# Patient Record
Sex: Female | Born: 1950
Health system: Southern US, Community
[De-identification: ages and names within clinical notes are randomized; demographics above are authoritative.]

## PROBLEM LIST (undated history)

## (undated) DIAGNOSIS — B029 Zoster without complications: Secondary | ICD-10-CM

## (undated) DIAGNOSIS — E785 Hyperlipidemia, unspecified: Secondary | ICD-10-CM

## (undated) DIAGNOSIS — C801 Malignant (primary) neoplasm, unspecified: Secondary | ICD-10-CM

## (undated) DIAGNOSIS — E559 Vitamin D deficiency, unspecified: Secondary | ICD-10-CM

## (undated) HISTORY — PX: BREAST SURGERY: SHX581

## (undated) HISTORY — DX: Vitamin D deficiency, unspecified: E55.9

## (undated) HISTORY — DX: Zoster without complications: B02.9

## (undated) HISTORY — PX: BREAST LUMPECTOMY: SHX2

## (undated) HISTORY — DX: Hyperlipidemia, unspecified: E78.5

---

## 1998-06-26 ENCOUNTER — Encounter: Admission: RE | Admit: 1998-06-26 | Discharge: 1998-09-24 | Payer: Self-pay | Admitting: Radiation Oncology

## 1998-11-03 DIAGNOSIS — C801 Malignant (primary) neoplasm, unspecified: Secondary | ICD-10-CM

## 1998-11-03 HISTORY — DX: Malignant (primary) neoplasm, unspecified: C80.1

## 1999-11-04 HISTORY — PX: SHOULDER SURGERY: SHX246

## 2000-05-14 ENCOUNTER — Ambulatory Visit (HOSPITAL_BASED_OUTPATIENT_CLINIC_OR_DEPARTMENT_OTHER): Admission: RE | Admit: 2000-05-14 | Discharge: 2000-05-14 | Payer: Self-pay | Admitting: Orthopedic Surgery

## 2001-04-27 ENCOUNTER — Other Ambulatory Visit: Admission: RE | Admit: 2001-04-27 | Discharge: 2001-04-27 | Payer: Self-pay | Admitting: Obstetrics and Gynecology

## 2001-05-21 ENCOUNTER — Ambulatory Visit (HOSPITAL_COMMUNITY): Admission: RE | Admit: 2001-05-21 | Discharge: 2001-05-21 | Payer: Self-pay | Admitting: Obstetrics and Gynecology

## 2001-05-21 ENCOUNTER — Encounter: Payer: Self-pay | Admitting: Obstetrics and Gynecology

## 2001-06-17 ENCOUNTER — Ambulatory Visit (HOSPITAL_COMMUNITY): Admission: RE | Admit: 2001-06-17 | Discharge: 2001-06-17 | Payer: Self-pay | Admitting: Obstetrics and Gynecology

## 2002-05-26 ENCOUNTER — Encounter (HOSPITAL_COMMUNITY): Payer: Self-pay | Admitting: Oncology

## 2002-05-26 ENCOUNTER — Ambulatory Visit (HOSPITAL_COMMUNITY): Admission: RE | Admit: 2002-05-26 | Discharge: 2002-05-26 | Payer: Self-pay | Admitting: Oncology

## 2003-08-01 ENCOUNTER — Encounter (HOSPITAL_COMMUNITY): Payer: Self-pay | Admitting: Oncology

## 2003-08-01 ENCOUNTER — Encounter (HOSPITAL_COMMUNITY): Admission: RE | Admit: 2003-08-01 | Discharge: 2003-08-31 | Payer: Self-pay | Admitting: Oncology

## 2006-08-25 ENCOUNTER — Ambulatory Visit (HOSPITAL_COMMUNITY): Admission: RE | Admit: 2006-08-25 | Discharge: 2006-08-25 | Payer: Self-pay | Admitting: Family Medicine

## 2008-09-11 ENCOUNTER — Ambulatory Visit: Payer: Self-pay | Admitting: Family Medicine

## 2008-09-11 DIAGNOSIS — R252 Cramp and spasm: Secondary | ICD-10-CM

## 2008-09-11 DIAGNOSIS — Z9889 Other specified postprocedural states: Secondary | ICD-10-CM | POA: Insufficient documentation

## 2008-09-11 DIAGNOSIS — J309 Allergic rhinitis, unspecified: Secondary | ICD-10-CM | POA: Insufficient documentation

## 2008-09-15 ENCOUNTER — Encounter (INDEPENDENT_AMBULATORY_CARE_PROVIDER_SITE_OTHER): Payer: Self-pay | Admitting: Family Medicine

## 2008-09-18 ENCOUNTER — Ambulatory Visit: Payer: Self-pay | Admitting: Family Medicine

## 2008-09-18 ENCOUNTER — Other Ambulatory Visit: Admission: RE | Admit: 2008-09-18 | Discharge: 2008-09-18 | Payer: Self-pay | Admitting: Family Medicine

## 2008-09-18 ENCOUNTER — Encounter (INDEPENDENT_AMBULATORY_CARE_PROVIDER_SITE_OTHER): Payer: Self-pay | Admitting: Family Medicine

## 2008-09-18 DIAGNOSIS — R7309 Other abnormal glucose: Secondary | ICD-10-CM

## 2008-09-18 DIAGNOSIS — E785 Hyperlipidemia, unspecified: Secondary | ICD-10-CM

## 2008-09-18 LAB — CONVERTED CEMR LAB
AST: 17 units/L (ref 0–37)
BUN: 21 mg/dL (ref 6–23)
Basophils Relative: 1 % (ref 0–1)
CO2: 24 meq/L (ref 19–32)
Calcium: 9.4 mg/dL (ref 8.4–10.5)
Chloride: 102 meq/L (ref 96–112)
Cholesterol: 224 mg/dL — ABNORMAL HIGH (ref 0–200)
Creatinine, Ser: 0.8 mg/dL (ref 0.40–1.20)
Glucose, Bld: 101 mg/dL — ABNORMAL HIGH (ref 70–99)
HDL goal, serum: 40 mg/dL
HDL: 66 mg/dL (ref >39)
Hemoglobin: 13.7 g/dL (ref 12.0–15.0)
Lymphs Abs: 2.5 10*3/uL (ref 0.7–4.0)
MCHC: 31 g/dL (ref 30.0–36.0)
Monocytes Absolute: 0.6 10*3/uL (ref 0.1–1.0)
Monocytes Relative: 8 % (ref 3–12)
Neutro Abs: 4.2 10*3/uL (ref 1.7–7.7)
RBC: 4.58 M/uL (ref 3.87–5.11)
Total CHOL/HDL Ratio: 3.4
Triglycerides: 220 mg/dL — ABNORMAL HIGH (ref <150)

## 2008-11-21 ENCOUNTER — Ambulatory Visit (HOSPITAL_COMMUNITY): Admission: RE | Admit: 2008-11-21 | Discharge: 2008-11-21 | Payer: Self-pay | Admitting: Family Medicine

## 2009-06-26 ENCOUNTER — Encounter (INDEPENDENT_AMBULATORY_CARE_PROVIDER_SITE_OTHER): Payer: Self-pay | Admitting: Family Medicine

## 2009-10-03 ENCOUNTER — Ambulatory Visit (HOSPITAL_COMMUNITY): Admission: RE | Admit: 2009-10-03 | Discharge: 2009-10-03 | Payer: Self-pay | Admitting: Urology

## 2009-12-06 ENCOUNTER — Ambulatory Visit (HOSPITAL_COMMUNITY): Admission: RE | Admit: 2009-12-06 | Discharge: 2009-12-06 | Payer: Self-pay | Admitting: Internal Medicine

## 2010-11-24 ENCOUNTER — Encounter: Payer: Self-pay | Admitting: Family Medicine

## 2011-01-01 IMAGING — CT CT PELVIS WO/W CM
2 of 7 series · 12 of 46 positions shown, 18 images · IV contrast (omnipaque)
Comparison: Chest none

CT ABDOMEN

CLINICAL DATA: Microscopic hematuria, low back pain, past history
of breast cancer status post surgery and radiation therapy

CT ABDOMEN AND PELVIS WITHOUT AND WITH CONTRAST
TECHNIQUE: Multidetector CT imaging of the abdomen and pelvis was
performed following the standard protocol before and following the
bolus administration of intravenous contrast. Breast shield
utilized.  Sagittal and coronal MPR images reconstructed from axial
data set.
Contrast: 125 ml Omnipaque-MSS IV; water as oral contra

[Series 6: abd_pel_with 5.0 b40f · axial · 0.66mm/px · z∈[-424,-64]mm · 9 of 91 slices shown, 15 images]
[im 10/91  soft-tissue]
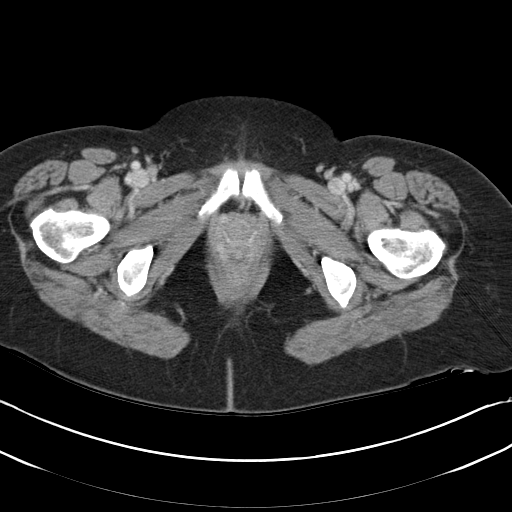
[im 10/91  bone]
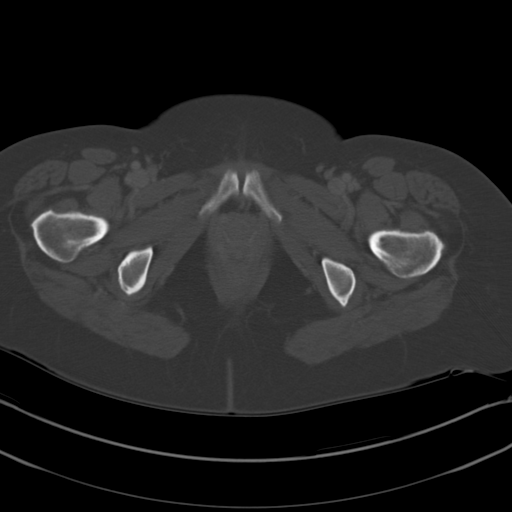
[im 19/91  soft-tissue]
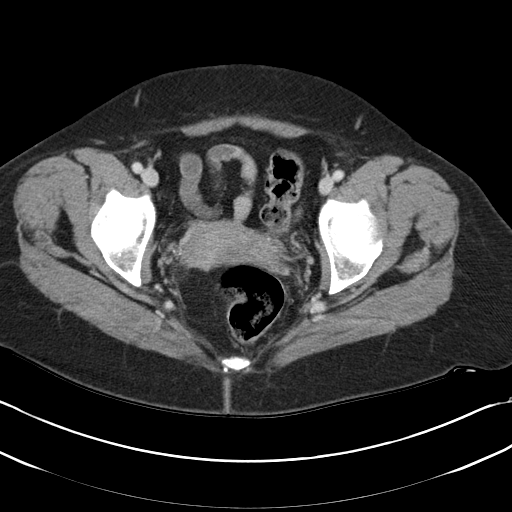
[im 28/91  soft-tissue]
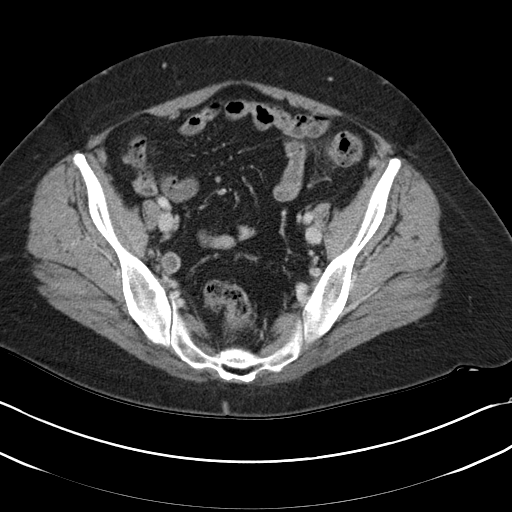
[im 37/91  soft-tissue]
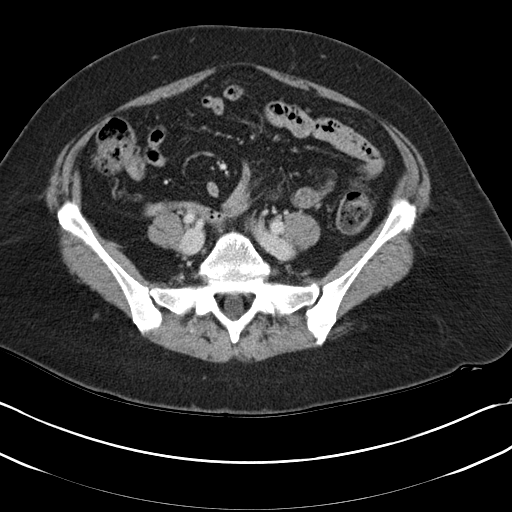
[im 46/91  soft-tissue]
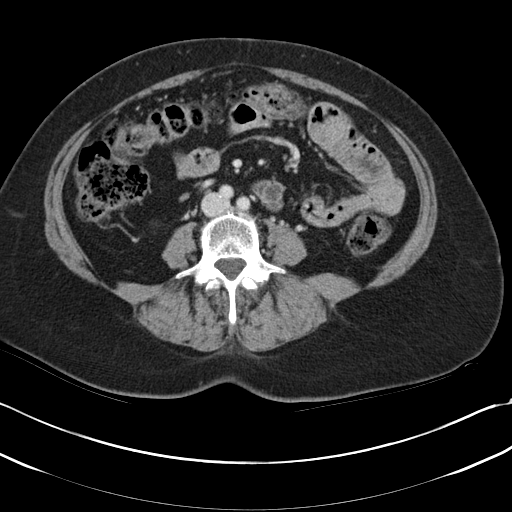
[im 55/91  soft-tissue]
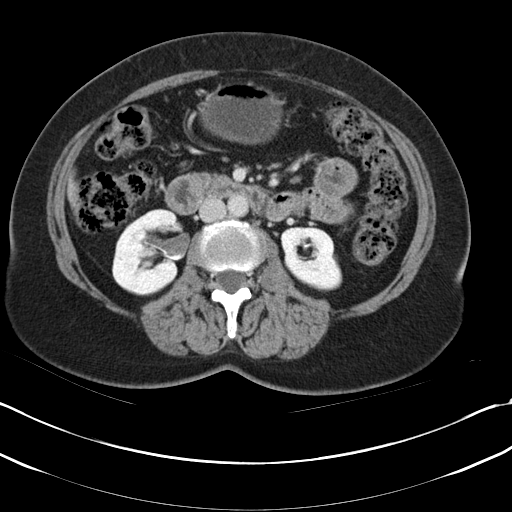
[im 55/91  lung]
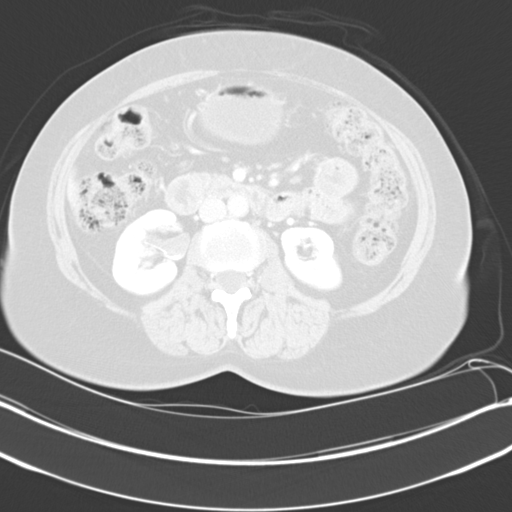
[im 64/91  soft-tissue]
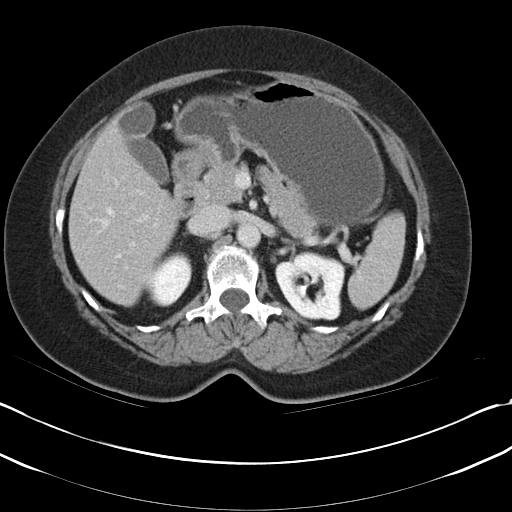
[im 64/91  lung]
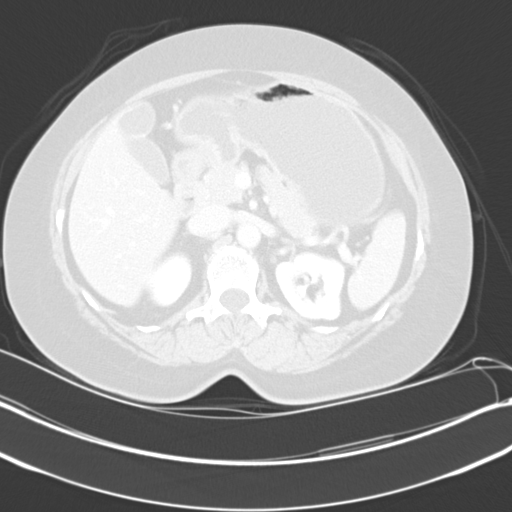
[im 73/91  soft-tissue]
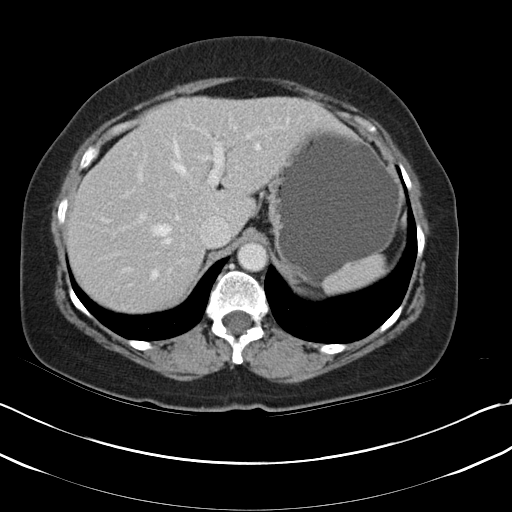
[im 73/91  lung]
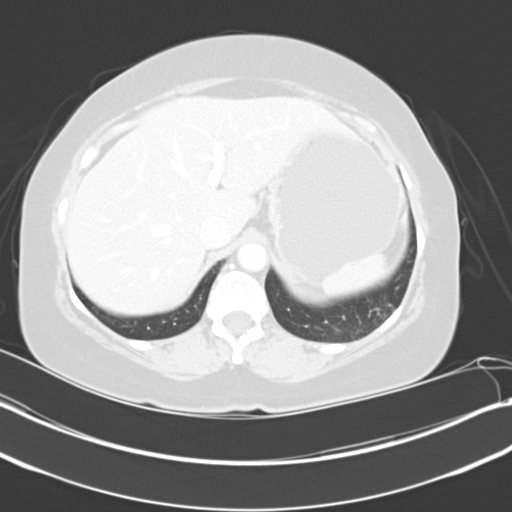
[im 82/91  soft-tissue]
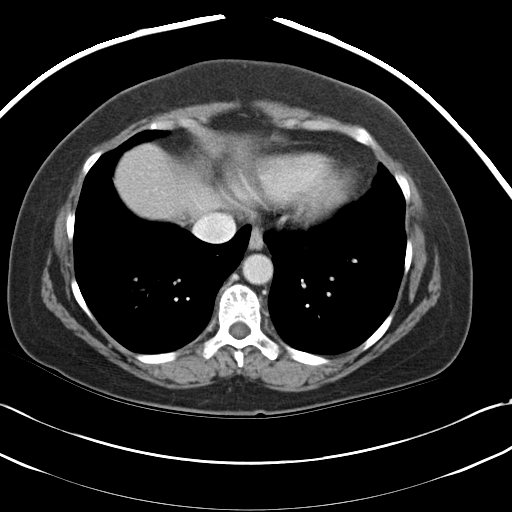
[im 82/91  lung]
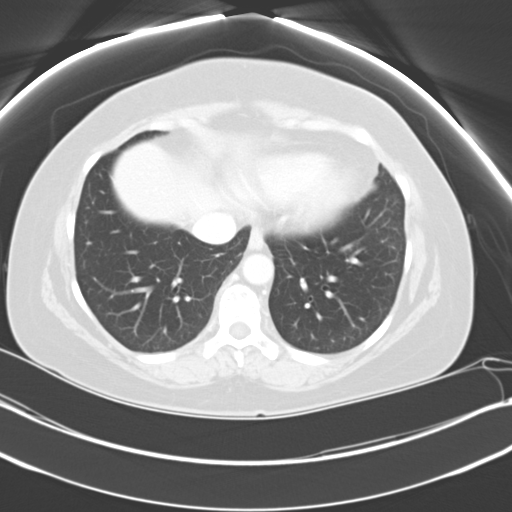
[im 82/91  bone]
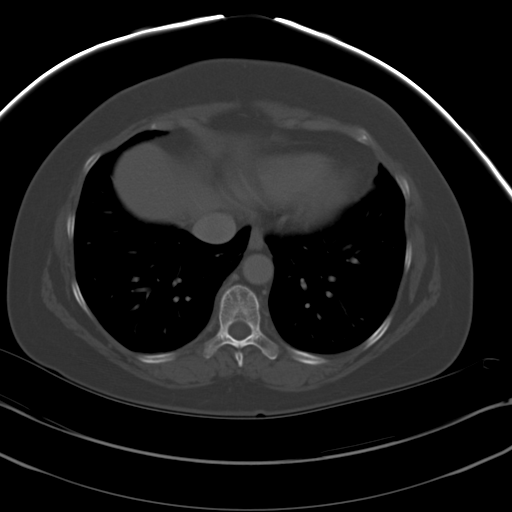

[Series 8: mpr coronal (id) · coronal · 0.65mm/px · 3 of 67 slices shown]
[im 17/67  soft-tissue]
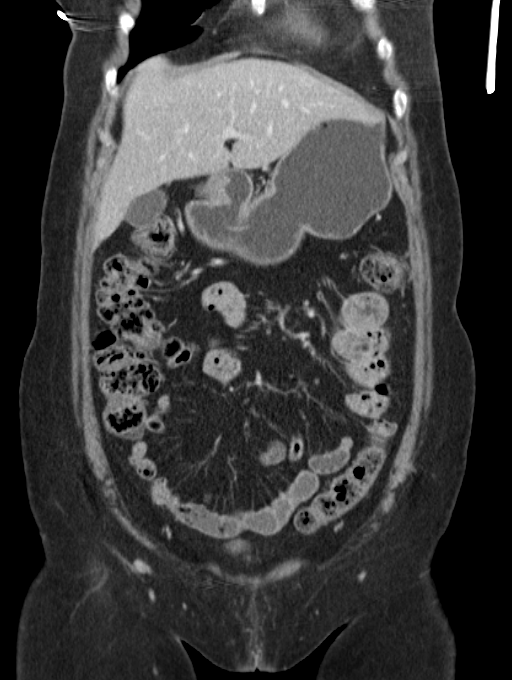
[im 34/67  soft-tissue]
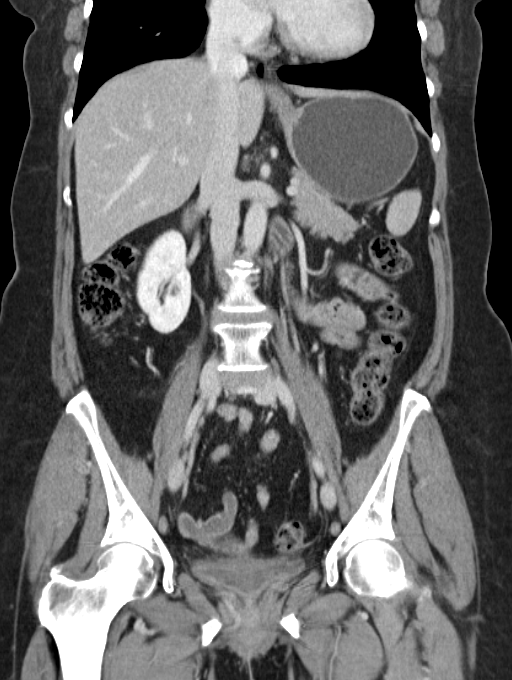
[im 50/67  soft-tissue]
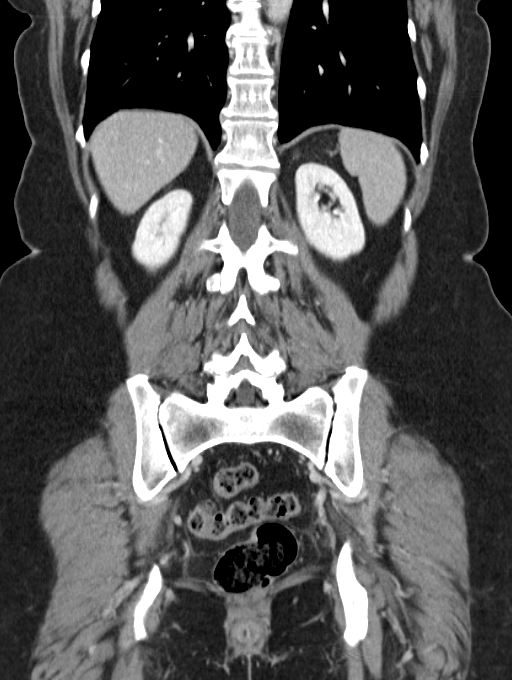

[12 of 46 positions shown; findings below may reference images not displayed]

FINDINGS: Lung bases clear.
No urinary tract calcification identified.
Liver, spleen, pancreas, kidneys, and adrenal glands normal.
Symmetric nephrograms bilaterally.
Multiple small cysts in both kidneys, more on the right, largest
right renal cyst measuring 1.9 x 1.4 cm image 33.
Mild right renal collecting system dilatation, best appreciated on
delayed coronal images.
Normal-caliber single ureters.
No filling defects identified within renal collecting systems or
ureters bilaterally.
Stomach and upper abdominal bowel loops normal appearance.
No mass, adenopathy, free fluid or inflammatory process.
IMPRESSION: Bilateral renal cysts.
Mild right hydronephrosis without evidence of mass or calculus.

CT PELVIS
FINDINGS: No distal ureteral calcification or dilatation.
Bladder unremarkable without mass or wall thickening.
Large and small bowel loops in pelvis normal appearance.
Normal appendix.
No pelvic mass, adenopathy, free fluid or inflammatory process.
Unremarkable uterus and adnexae.
No acute bony findings.
IMPRESSION: No acute intrapelvic abnormalities.

## 2011-03-21 NOTE — Op Note (Signed)
[facility]  Patient:    Ashley Lloyd, Ashley Lloyd                          MRN: [id number] Proc. Date: 05/14/00 Attending:  Nadara Mustard, M.D.                           Operative Report  PREOPERATIVE DIAGNOSIS:  Partial versus full-thickness tear, left shoulder, with impingement syndrome.  POSTOPERATIVE DIAGNOSES: 1. Grade 1 superior labrum anterior and posterior lesion. 2. Impingement syndrome with type 3 hooked acromion.  PROCEDURE: 1. Left shoulder arthroscopy with debridement of superior labrum anterior and    posterior lesion. 2. Subacromial decompression.  SURGEON:  Nadara Mustard, M.D.  ANESTHESIA:  General endotracheal plus interscalen block.  DISPOSITION:  To PACU in stable condition.  INDICATION FOR PROCEDURE:  Patient is a 60 year old woman with a longstanding history of increasing pain and decreasing range of motion of her left shoulder.  Patient has undergone conservative care with injections without relief.  MRI scan was positive for partial versus full-thickness rotator cuff tear and patient presents at this time for arthroscopic intervention.  The risks and benefits were discussed including infection, neurovascular injury, persistent pain, further rupture or tear of the rotator cuff; patient states she understands and wishes to proceed at this time.  DESCRIPTION OF PROCEDURE:  Patient was brought to OR #6 after undergoing an interscalen block.  She then underwent a general endotracheal anesthetic. After adequate level of anesthesia was obtained, patient was placed in the beach chair position and her left upper extremity was prepped using DuraPrep and draped into a sterile field.  The scope was inserted through the posterior portal.  Visualization showed an intact biceps and intact subscapularis tendon.  She did have fraying of her superior glenohumeral ligament as well as fraying of the labrum and insertion of the biceps.  Glenoid  and humeral head were intact with no osteochondral defects.  An operating portal was established anteriorly with the outside-in technique, first localized with an 18-gauge spinal.  Using the Linvatec probe, this was used to ablate bleeding vessels.  The shaver was used to debride the SLAP tear.  After debridement was performed, visualization of the entire rotator cuff showed there to be no full-thickness defects.  There was good attachment of the rotator cuff. Instruments were then removed and the scope was inserted through the subacromial space from the posterior portal and a lateral portal was established as a working portal.  A subacromial decompression was performed. The Linvatec ablator was used for hemostasis.   The acromionizer and shaver were used to debride the acromion.  The instruments were removed.  The portals were closed using 4-0 nylon.  The wound was covered with Adaptic, orthopedic sponges, ABD dressing and Hypafix tape.  Patient was extubated, placed in a sling and taken to PACU in stable condition.  Plan to follow up in the office in one week. DD:  05/14/00 TD:  05/14/00 Job: [id number]

## 2013-02-04 ENCOUNTER — Emergency Department (HOSPITAL_COMMUNITY)
Admission: EM | Admit: 2013-02-04 | Discharge: 2013-02-04 | Disposition: A | Discharge status: Home / Self Care | Payer: 59 | Attending: Emergency Medicine | Admitting: Emergency Medicine

## 2013-02-04 ENCOUNTER — Emergency Department (HOSPITAL_COMMUNITY): Payer: 59

## 2013-02-04 ENCOUNTER — Encounter (HOSPITAL_COMMUNITY): Payer: Self-pay | Admitting: *Deleted

## 2013-02-04 DIAGNOSIS — W010XXA Fall on same level from slipping, tripping and stumbling without subsequent striking against object, initial encounter: Secondary | ICD-10-CM | POA: Insufficient documentation

## 2013-02-04 DIAGNOSIS — S8002XA Contusion of left knee, initial encounter: Secondary | ICD-10-CM

## 2013-02-04 DIAGNOSIS — S8000XA Contusion of unspecified knee, initial encounter: Secondary | ICD-10-CM | POA: Insufficient documentation

## 2013-02-04 DIAGNOSIS — Z853 Personal history of malignant neoplasm of breast: Secondary | ICD-10-CM | POA: Insufficient documentation

## 2013-02-04 DIAGNOSIS — W1809XA Striking against other object with subsequent fall, initial encounter: Secondary | ICD-10-CM | POA: Insufficient documentation

## 2013-02-04 DIAGNOSIS — S93409A Sprain of unspecified ligament of unspecified ankle, initial encounter: Secondary | ICD-10-CM | POA: Insufficient documentation

## 2013-02-04 DIAGNOSIS — Y929 Unspecified place or not applicable: Secondary | ICD-10-CM | POA: Insufficient documentation

## 2013-02-04 DIAGNOSIS — Y9301 Activity, walking, marching and hiking: Secondary | ICD-10-CM | POA: Insufficient documentation

## 2013-02-04 HISTORY — DX: Malignant (primary) neoplasm, unspecified: C80.1

## 2013-02-04 MED ORDER — HYDROCODONE-ACETAMINOPHEN 5-325 MG PO TABS
2.0000 | ORAL_TABLET | Freq: Once | ORAL | Status: AC
  Administered 2013-02-04: 2 via ORAL
  Filled 2013-02-04: qty 2

## 2013-02-04 MED ORDER — HYDROCODONE-ACETAMINOPHEN 5-325 MG PO TABS: 1.0000 | ORAL_TABLET | ORAL | Status: DC | PRN

## 2013-02-04 NOTE — ED Provider Notes (Signed)
History     CSN: [id number]  Arrival date & time 02/04/13  1617   First MD Initiated Contact with Patient 02/04/13 1638      Chief Complaint  Patient presents with  . Fall    (Consider location/radiation/quality/duration/timing/severity/associated sxs/prior treatment) Patient is a 62 y.o. female presenting with fall. The history is provided by the patient.  Fall The accident occurred 6 to 12 hours ago. The fall occurred while walking. She landed on a hard floor. There was no blood loss. The point of impact was the left knee. The pain is present in the left knee (right ankle.). The pain is moderate. She was ambulatory at the scene. There was no drug use involved in the accident. There was no alcohol use involved in the accident. Pertinent negatives include no numbness, no abdominal pain, no bowel incontinence, no hematuria, no loss of consciousness and no tingling. The symptoms are aggravated by standing and ambulation. She has tried nothing for the symptoms.    Past Medical History  Diagnosis Date  . Cancer     Past Surgical History  Procedure Laterality Date  . Shoulder surgery    . Breast surgery    . Breast lumpectomy      History reviewed. No pertinent family history.  History  Substance Use Topics  . Smoking status: Never Smoker   . Smokeless tobacco: Not on file  . Alcohol Use: No    OB History   Grav Para Term Preterm Abortions TAB SAB Ect Mult Living                  Review of Systems  Constitutional: Negative for activity change.       All ROS Neg except as noted in HPI  HENT: Negative for nosebleeds and neck pain.   Eyes: Negative for photophobia and discharge.  Respiratory: Negative for cough, shortness of breath and wheezing.   Cardiovascular: Negative for chest pain and palpitations.  Gastrointestinal: Negative for abdominal pain, blood in stool and bowel incontinence.  Genitourinary: Negative for dysuria, frequency and hematuria.  Musculoskeletal:  Positive for arthralgias. Negative for back pain.  Skin: Negative.   Neurological: Negative for dizziness, tingling, seizures, loss of consciousness, speech difficulty and numbness.  Psychiatric/Behavioral: Negative for hallucinations and confusion.    Allergies  Celecoxib  Home Medications  No current outpatient prescriptions on file.  BP 151/63  Pulse 63  Temp(Src) 98.4 F (36.9 C) (Oral)  Resp 20  Ht 5' (1.524 m)  Wt 130 lb (58.968 kg)  BMI 25.39 kg/m2  SpO2 100%  Physical Exam  Nursing note and vitals reviewed. Constitutional: She is oriented to person, place, and time. She appears well-developed and well-nourished.  Non-toxic appearance.  HENT:  Head: Normocephalic.  Right Ear: Tympanic membrane and external ear normal.  Left Ear: Tympanic membrane and external ear normal.  Eyes: EOM and lids are normal. Pupils are equal, round, and reactive to light.  Neck: Normal range of motion. Neck supple. Carotid bruit is not present.  Cardiovascular: Normal rate, regular rhythm, normal heart sounds, intact distal pulses and normal pulses.   Pulmonary/Chest: Breath sounds normal. No respiratory distress.  Abdominal: Soft. Bowel sounds are normal. There is no tenderness. There is no guarding.  Musculoskeletal: Normal range of motion.  There is pain and mild bruise  To the anterior left knee.  No hot joint. Mild crepitus present with ROM. Good ROM of the right knee and hip. Pain with attempted ROM of the right  ankle. Achilles intact. Mild swelling of the right ankle. DP 2+.  Lymphadenopathy:       Head (right side): No submandibular adenopathy present.       Head (left side): No submandibular adenopathy present.    She has no cervical adenopathy.  Neurological: She is alert and oriented to person, place, and time. She has normal strength. No cranial nerve deficit or sensory deficit.  Skin: Skin is warm and dry.  Psychiatric: She has a normal mood and affect. Her speech is normal.     ED Course  Procedures (including critical care time)  Labs Reviewed - No data to display No results found.   No diagnosis found.    MDM  I have reviewed nursing notes, vital signs, and all appropriate lab and imaging results for this patient.  Xray of the right ankle is negative for fx. Suspect ankle sprain and contusion to the left knee. Pt has a walker at home to use. She is fitted with ASO splint and Rx for Norco given. Pt to follow up with orthopedics for additional evaluation if not improving.      Kathie Dike, PA-C 02/05/13 [phone]

## 2013-02-04 NOTE — ED Notes (Addendum)
Fall, tripped when moving wood,  Pain rt ankle, foot.  And lt knee  Ice and elevation rt ankle

## 2013-02-05 NOTE — ED Provider Notes (Signed)
Medical screening examination/treatment/procedure(s) were performed by non-physician practitioner and as supervising physician I was immediately available for consultation/collaboration.   Benny Lennert, MD 02/05/13 [phone]

## 2013-02-15 ENCOUNTER — Emergency Department (INDEPENDENT_AMBULATORY_CARE_PROVIDER_SITE_OTHER)
Admission: EM | Admit: 2013-02-15 | Discharge: 2013-02-15 | Disposition: A | Discharge status: Home / Self Care | Payer: 59 | Source: Home / Self Care | Attending: Family Medicine | Admitting: Family Medicine

## 2013-02-15 ENCOUNTER — Encounter (HOSPITAL_COMMUNITY): Payer: Self-pay

## 2013-02-15 ENCOUNTER — Emergency Department (INDEPENDENT_AMBULATORY_CARE_PROVIDER_SITE_OTHER): Payer: 59

## 2013-02-15 DIAGNOSIS — M7062 Trochanteric bursitis, left hip: Secondary | ICD-10-CM

## 2013-02-15 DIAGNOSIS — M76899 Other specified enthesopathies of unspecified lower limb, excluding foot: Secondary | ICD-10-CM

## 2013-02-15 MED ORDER — DICLOFENAC POTASSIUM 50 MG PO TABS: 50.0000 mg | ORAL_TABLET | Freq: Three times a day (TID) | ORAL | Status: DC

## 2013-02-15 NOTE — ED Notes (Signed)
States she fell about 2 weeks ago onto RIGHT side, but has only recently developed pain in her left hip. C/o she cannot bear weight

## 2013-02-15 NOTE — ED Provider Notes (Signed)
History     CSN: [id number]  Arrival date & time 02/15/13  1111   First MD Initiated Contact with Patient 02/15/13 1151      Chief Complaint  Patient presents with  . Hip Pain    (Consider location/radiation/quality/duration/timing/severity/associated sxs/prior treatment) Patient is a 62 y.o. female presenting with hip pain. The history is provided by the patient.  Hip Pain This is a new problem. The current episode started 2 days ago (fell on 4/4 and sprained right ankle, seen in ER, recently develpoed left hip pain, affecting walking.). The problem has been gradually worsening. The symptoms are aggravated by walking.    Past Medical History  Diagnosis Date  . Cancer     Past Surgical History  Procedure Laterality Date  . Shoulder surgery    . Breast surgery    . Breast lumpectomy      History reviewed. No pertinent family history.  History  Substance Use Topics  . Smoking status: Never Smoker   . Smokeless tobacco: Not on file  . Alcohol Use: No    OB History   Grav Para Term Preterm Abortions TAB SAB Ect Mult Living                  Review of Systems  Constitutional: Negative.   Musculoskeletal: Positive for gait problem. Negative for myalgias, back pain and joint swelling.  Neurological: Negative for numbness.    Allergies  Celecoxib  Home Medications   Current Outpatient Rx  Name  Route  Sig  Dispense  Refill  . HYDROcodone-acetaminophen (NORCO/VICODIN) 5-325 MG per tablet   Oral   Take 1 tablet by mouth every 4 (four) hours as needed for pain.   15 tablet   0   . diclofenac (CATAFLAM) 50 MG tablet   Oral   Take 1 tablet (50 mg total) by mouth 3 (three) times daily.   30 tablet   0     BP 140/58  Pulse 64  Temp(Src) 97.8 F (36.6 C) (Oral)  Resp 16  SpO2 100%  Physical Exam  Nursing note and vitals reviewed. Constitutional: She is oriented to person, place, and time. She appears well-developed and well-nourished.    Musculoskeletal: She exhibits tenderness.       Left hip: She exhibits normal range of motion, no swelling, no crepitus and no deformity.       Legs: Neurological: She is alert and oriented to person, place, and time.  Skin: Skin is warm and dry.    ED Course  Procedures (including critical care time)  Labs Reviewed - No data to display Dg Hip Complete Left  02/15/2013  *RADIOLOGY REPORT*  Clinical Data: Left hip pain since falling approximately 10 days ago.  LEFT HIP - COMPLETE 2+ VIEW  Comparison: Abdominal pelvic CT 10/03/2009.  Findings: The mineralization and alignment are normal.  There is no evidence of acute fracture or dislocation.  There is no evidence of femoral head avascular necrosis.  Mild degenerative changes are present at both hips.  In addition, there is spurring at both greater trochanters, similar to the prior CT.  IMPRESSION: Stable examination.  No acute osseous findings.   Original Report Authenticated By: Carey Bullocks, M.D.      1. Greater trochanteric bursitis of left hip       MDM  X-rays reviewed and report per radiologist.         Linna Hoff, MD 02/15/13 [phone]

## 2015-02-05 ENCOUNTER — Emergency Department (INDEPENDENT_AMBULATORY_CARE_PROVIDER_SITE_OTHER)
Admission: EM | Admit: 2015-02-05 | Discharge: 2015-02-05 | Disposition: A | Discharge status: Other Acute Inpatient Hospital | Payer: BLUE CROSS/BLUE SHIELD | Source: Home / Self Care | Attending: Family Medicine | Admitting: Family Medicine

## 2015-02-05 ENCOUNTER — Emergency Department (HOSPITAL_COMMUNITY)
Admission: EM | Admit: 2015-02-05 | Discharge: 2015-02-06 | Disposition: A | Discharge status: Home / Self Care | Payer: BLUE CROSS/BLUE SHIELD | Attending: Emergency Medicine | Admitting: Emergency Medicine

## 2015-02-05 ENCOUNTER — Emergency Department (HOSPITAL_COMMUNITY): Payer: BLUE CROSS/BLUE SHIELD

## 2015-02-05 ENCOUNTER — Encounter (HOSPITAL_COMMUNITY): Payer: Self-pay

## 2015-02-05 DIAGNOSIS — B0229 Other postherpetic nervous system involvement: Secondary | ICD-10-CM | POA: Diagnosis not present

## 2015-02-05 DIAGNOSIS — Z859 Personal history of malignant neoplasm, unspecified: Secondary | ICD-10-CM | POA: Insufficient documentation

## 2015-02-05 DIAGNOSIS — R1011 Right upper quadrant pain: Secondary | ICD-10-CM

## 2015-02-05 DIAGNOSIS — Z791 Long term (current) use of non-steroidal anti-inflammatories (NSAID): Secondary | ICD-10-CM | POA: Diagnosis not present

## 2015-02-05 DIAGNOSIS — B0223 Postherpetic polyneuropathy: Secondary | ICD-10-CM

## 2015-02-05 LAB — CBC WITH DIFFERENTIAL/PLATELET
Basophils Absolute: 0.1 10*3/uL (ref 0.0–0.1)
Basophils Relative: 1 % (ref 0–1)
Eosinophils Absolute: 0.1 10*3/uL (ref 0.0–0.7)
Eosinophils Relative: 2 % (ref 0–5)
HEMATOCRIT: 42.7 % (ref 36.0–46.0)
HEMOGLOBIN: 14.2 g/dL (ref 12.0–15.0)
LYMPHS PCT: 29 % (ref 12–46)
Lymphs Abs: 2.4 10*3/uL (ref 0.7–4.0)
MCH: 30.9 pg (ref 26.0–34.0)
MCHC: 33.3 g/dL (ref 30.0–36.0)
MCV: 92.8 fL (ref 78.0–100.0)
MONO ABS: 0.7 10*3/uL (ref 0.1–1.0)
MONOS PCT: 8 % (ref 3–12)
NEUTROS ABS: 5.1 10*3/uL (ref 1.7–7.7)
Neutrophils Relative %: 60 % (ref 43–77)
Platelets: 297 10*3/uL (ref 150–400)
RBC: 4.6 MIL/uL (ref 3.87–5.11)
RDW: 14 % (ref 11.5–15.5)
WBC: 8.3 10*3/uL (ref 4.0–10.5)

## 2015-02-05 LAB — COMPREHENSIVE METABOLIC PANEL
ALBUMIN: 4.2 g/dL (ref 3.5–5.2)
ALT: 15 U/L (ref 0–35)
ANION GAP: 7 (ref 5–15)
AST: 23 U/L (ref 0–37)
Alkaline Phosphatase: 66 U/L (ref 39–117)
BUN: 14 mg/dL (ref 6–23)
CALCIUM: 9.4 mg/dL (ref 8.4–10.5)
CO2: 27 mmol/L (ref 19–32)
Chloride: 101 mmol/L (ref 96–112)
Creatinine, Ser: 0.86 mg/dL (ref 0.50–1.10)
GFR calc non Af Amer: 70 mL/min — ABNORMAL LOW (ref >90)
GFR, EST AFRICAN AMERICAN: 82 mL/min — AB (ref >90)
GLUCOSE: 98 mg/dL (ref 70–99)
Potassium: 4.2 mmol/L (ref 3.5–5.1)
Sodium: 135 mmol/L (ref 135–145)
TOTAL PROTEIN: 7.5 g/dL (ref 6.0–8.3)
Total Bilirubin: 0.7 mg/dL (ref 0.3–1.2)

## 2015-02-05 LAB — LIPASE, BLOOD: Lipase: 38 U/L (ref 11–59)

## 2015-02-05 LAB — I-STAT TROPONIN, ED: TROPONIN I, POC: 0 ng/mL (ref 0.00–0.08)

## 2015-02-05 MED ORDER — HYDROCODONE-ACETAMINOPHEN 5-325 MG PO TABS
1.0000 | ORAL_TABLET | Freq: Once | ORAL | Status: AC
  Administered 2015-02-05: 1 via ORAL

## 2015-02-05 MED ORDER — ONDANSETRON 4 MG PO TBDP
8.0000 mg | ORAL_TABLET | Freq: Once | ORAL | Status: AC
  Administered 2015-02-05: 8 mg via ORAL

## 2015-02-05 MED ORDER — OXYCODONE-ACETAMINOPHEN 5-325 MG PO TABS: 1.0000 | ORAL_TABLET | Freq: Four times a day (QID) | ORAL | Status: DC | PRN

## 2015-02-05 MED ORDER — ONDANSETRON 4 MG PO TBDP
ORAL_TABLET | ORAL | Status: AC
  Filled 2015-02-05: qty 1

## 2015-02-05 MED ORDER — OXYCODONE-ACETAMINOPHEN 5-325 MG PO TABS
1.0000 | ORAL_TABLET | Freq: Once | ORAL | Status: AC
Start: 2015-02-05 — End: 2015-02-05
  Administered 2015-02-05: 1 via ORAL
  Filled 2015-02-05: qty 1

## 2015-02-05 MED ORDER — HYDROCODONE-ACETAMINOPHEN 5-325 MG PO TABS
ORAL_TABLET | ORAL | Status: AC
  Filled 2015-02-05: qty 1

## 2015-02-05 MED ORDER — ACYCLOVIR 800 MG PO TABS: 800.0000 mg | ORAL_TABLET | Freq: Every day | ORAL | Status: AC

## 2015-02-05 NOTE — ED Provider Notes (Signed)
Ashley Lloyd is a 64 y.o. female who presents to Urgent Care today for abdominal pain. Patient has a 4 day history of worsening right upper quadrant abdominal pain radiating to the back. The pain seems to worse after eating. No fevers chills vomiting or diarrhea. The pain is nonexertional. She feels well otherwise. She has tried ibuprofen which has not helped.   Past Medical History  Diagnosis Date  . Cancer    Past Surgical History  Procedure Laterality Date  . Shoulder surgery    . Breast surgery    . Breast lumpectomy     History  Substance Use Topics  . Smoking status: Never Smoker   . Smokeless tobacco: Not on file  . Alcohol Use: No   ROS as above Medications: No current facility-administered medications for this encounter.   Current Outpatient Prescriptions  Medication Sig Dispense Refill  . diclofenac (CATAFLAM) 50 MG tablet Take 1 tablet (50 mg total) by mouth 3 (three) times daily. 30 tablet 0  . HYDROcodone-acetaminophen (NORCO/VICODIN) 5-325 MG per tablet Take 1 tablet by mouth every 4 (four) hours as needed for pain. 15 tablet 0   Allergies  Allergen Reactions  . Celecoxib Swelling and Rash    CELEBREX REACTION: Rash , swelling     Exam:  BP 157/72 mmHg  Pulse 60  Temp(Src) 97.5 F (36.4 C) (Oral)  Resp 18  SpO2 100% Gen: Well NAD HEENT: EOMI,  MMM Lungs: Normal work of breathing. CTABL Heart: RRR no MRG Abd: NABS, Soft. Nondistended, tender palpation right upper quadrant with positive Murphy sign. Exts: Brisk capillary refill, warm and well perfused.   No results found for this or any previous visit (from the past 24 hour(s)). No results found.  Assessment and Plan: 64 y.o. female with 4 days of worsening right upper quadrant abdominal pain. Concerning for cholecystitis versus pancreatitis. Chest etiology is also a possibility. Transfer to emergency department for evaluation and management.  Patient was given 5 mg by 325 hydrocodone prior to  transfer.  Discussed warning signs or symptoms. Please see discharge instructions. Patient expresses understanding.     Gregor Hams, MD 02/05/15 1800

## 2015-02-05 NOTE — Discharge Instructions (Signed)
Shingles Shingles is caused by the same virus that causes chickenpox. The first feelings may be pain or tingling. A rash will follow in a couple days. The rash may occur on any area of the body. Long-lasting pain is more likely in an elderly person. It can last months to years. There are medicines that can help prevent pain if you start taking them early. HOME CARE   Take cool baths or place cool cloths on the rash as told by your doctor.  Take medicine only as told by your doctor.  Rest as told by your doctor.  Keep your rash clean with mild soap and cool water or as told by your doctor.  Do not scratch your rash. You may use calamine lotion to relieve itchy skin as told by your doctor.  Keep your rash covered with a loose bandage (dressing).  Avoid touching:  Babies.  Pregnant women.  Children with inflamed skin (eczema).  People who have gotten organ transplants.  People with chronic illnesses, such as leukemia or AIDS.  Wear loose-fitting clothing.  If the rash is on the face, you may need to see a specialist. Keep all appointments. Shingles must be kept away from the eyes, if possible.  Keep all follow-up visits as told by your doctor. GET HELP RIGHT AWAY IF:   You have any pain on the face or eye.  You lose feeling on one side of your face.  You have ear pain or ringing in your ear.  You cannot taste as well.  Your medicines do not help the pain.  Your redness or puffiness (swelling) spreads.  You feel like you are getting worse.  You have a fever. MAKE SURE YOU:   Understand these instructions.  Will watch your condition.  Will get help right away if you are not doing well or get worse. Document Released: 04/07/2008 Document Revised: 03/06/2014 Document Reviewed: 04/07/2008 Exit[employer] Patient Information 2015 [employer]. This information is not intended to replace advice given to you by your health care provider. Make sure you discuss any questions  you have with your health care provider.  

## 2015-02-05 NOTE — ED Notes (Signed)
Pt. Coming from urgent care. Here for mid epigastric pain that radiates around to back. Denies N/V/D, fevers, SOB. Took ibuprofen with no relief.

## 2015-02-05 NOTE — ED Notes (Signed)
C/o pain epigastric , right flank, right upper back x 3 days. No better w ice, motrin

## 2015-02-05 NOTE — ED Provider Notes (Signed)
CSN: [id number]     Arrival date & time 02/05/15  1836 History   First MD Initiated Contact with Patient 02/05/15 2207     Chief Complaint  Patient presents with  . Abdominal Pain     (Consider location/radiation/quality/duration/timing/severity/associated sxs/prior Treatment) Patient is a 64 y.o. female presenting with abdominal pain. The history is provided by the patient.  Abdominal Pain Pain location:  RUQ Pain quality: burning   Pain radiates to:  Back Pain severity:  Moderate Onset quality:  Gradual Duration:  4 days Timing:  Constant Progression:  Worsening Chronicity:  New Relieved by:  Nothing Worsened by:  Nothing tried Ineffective treatments:  NSAIDs Associated symptoms: no constipation, no cough, no dysuria, no fever, no melena, no nausea, no shortness of breath and no vomiting     Past Medical History  Diagnosis Date  . Cancer    Past Surgical History  Procedure Laterality Date  . Shoulder surgery    . Breast surgery    . Breast lumpectomy     No family history on file. History  Substance Use Topics  . Smoking status: Never Smoker   . Smokeless tobacco: Not on file  . Alcohol Use: No   OB History    No data available     Review of Systems  Constitutional: Negative for fever.  Respiratory: Negative for cough and shortness of breath.   Gastrointestinal: Positive for abdominal pain. Negative for nausea, vomiting, constipation and melena.  Genitourinary: Negative for dysuria.  All other systems reviewed and are negative.     Allergies  Celecoxib  Home Medications   Prior to Admission medications   Medication Sig Start Date End Date Taking? Authorizing Provider  ibuprofen (ADVIL,MOTRIN) 200 MG tablet Take 200 mg by mouth every 6 (six) hours as needed for moderate pain.   Yes Historical Provider, MD  acyclovir (ZOVIRAX) 800 MG tablet Take 1 tablet (800 mg total) by mouth 5 (five) times daily. 02/06/15 02/11/15  Larence Penning, MD  diclofenac  (CATAFLAM) 50 MG tablet Take 1 tablet (50 mg total) by mouth 3 (three) times daily. Patient not taking: Reported on 02/05/2015 02/15/13   Billy Fischer, MD  HYDROcodone-acetaminophen (NORCO/VICODIN) 5-325 MG per tablet Take 1 tablet by mouth every 4 (four) hours as needed for pain. Patient not taking: Reported on 02/05/2015 02/04/13   Lily Kocher, PA-C  oxyCODONE-acetaminophen (PERCOCET/ROXICET) 5-325 MG per tablet Take 1 tablet by mouth every 6 (six) hours as needed for severe pain. 02/05/15   Larence Penning, MD   BP 151/63 mmHg  Pulse 63  Temp(Src) 97.5 F (36.4 C)  Resp 18  SpO2 100% Physical Exam  Constitutional: She is oriented to person, place, and time. She appears well-developed and well-nourished. No distress.  HENT:  Head: Normocephalic and atraumatic.  Mouth/Throat: No oropharyngeal exudate.  Eyes: Pupils are equal, round, and reactive to light.  Neck: Normal range of motion.  Cardiovascular: Normal rate, regular rhythm, normal heart sounds and intact distal pulses.   Pulmonary/Chest: Effort normal and breath sounds normal. No respiratory distress. She has no wheezes. She has no rales.  Abdominal: Soft. She exhibits no distension. There is no tenderness.   nontender to deep palpation. Negative Murphy's. 4 or 5 small papules below the right breast in the area of described burning pain. No CVA tenderness.  Neurological: She is alert and oriented to person, place, and time.  Skin: Skin is warm and dry. No rash noted. She is not diaphoretic.  Psychiatric: She  has a normal mood and affect. Her behavior is normal. Judgment and thought content normal.    ED Course  Procedures (including critical care time) Labs Review Labs Reviewed  COMPREHENSIVE METABOLIC PANEL - Abnormal; Notable for the following:    GFR calc non Af Amer 70 (*)    GFR calc Af Amer 82 (*)    All other components within normal limits  CBC WITH DIFFERENTIAL/PLATELET  LIPASE, BLOOD  URINALYSIS, ROUTINE W REFLEX  MICROSCOPIC  I-STAT TROPOININ, ED    Imaging Review Dg Chest 2 View  02/05/2015   CLINICAL DATA:  Acute chest pain for 2 days.  EXAM: CHEST  2 VIEW  COMPARISON:  None.  FINDINGS: The heart size and mediastinal contours are within normal limits. Both lungs are clear. No pneumothorax or pleural effusion is noted. The visualized skeletal structures are unremarkable.  IMPRESSION: No active cardiopulmonary disease.   Electronically Signed   By: Marijo Conception, M.D.   On: 02/05/2015 19:52   US Abdomen Limited Ruq  02/05/2015   CLINICAL DATA:  Right upper quadrant and back pain for 3 days.  EXAM: US ABDOMEN LIMITED - RIGHT UPPER QUADRANT  COMPARISON:  None.  FINDINGS: Gallbladder:  No gallstones or wall thickening visualized. No sonographic Murphy sign noted.  Common bile duct:  Diameter: 3.3 mm, normal  Liver:  No focal lesion identified. Within normal limits in parenchymal echogenicity.  IMPRESSION: Normal examination.   Electronically Signed   By: Lucienne Capers M.D.   On: 02/05/2015 23:36     EKG Interpretation None      MDM   Final diagnoses:  RUQ pain  Acute herpes zoster neuropathy    64 year old female presents with burning right upper quadrant pain radiating around in a bandlike distribution to the back. Symptoms present for 4 days. Not associated with eating. She has no abdominal tenderness on my exam and negative Murphy's. Documentation from urgent care shows right upper quadrant tenderness with positive Murphy's. She does have some papules in the area of the burning pain , although she states this is not new. The description of the pain in the area with dermatomal distribution is highly concerning for possible zoster. However, given description of pain in documentation from urgent care, we'll obtain right upper quadrant ultrasound to rule out cholecystitis. Laboratory workup performed at urgent care with no elevation in LFTs, normal lipase , normal blood counts. Vital signs stable. If  ultrasound is negative, will treat for suspected Herpes Zoster with pain control and acyclovir.   11:45 PM ultrasound negative for biliary pathology. We'll treat as herpes zoster as documented above.  Larence Penning, MD 02/06/15 9450  Quintella Reichert, MD 02/06/15 [phone]

## 2015-02-22 ENCOUNTER — Encounter: Payer: Self-pay | Admitting: Internal Medicine

## 2015-02-22 ENCOUNTER — Ambulatory Visit (INDEPENDENT_AMBULATORY_CARE_PROVIDER_SITE_OTHER): Payer: BLUE CROSS/BLUE SHIELD | Admitting: Internal Medicine

## 2015-02-22 VITALS — BP 128/60 | HR 64 | Temp 98.0°F | Resp 14 | Ht 59.0 in | Wt 127.4 lb

## 2015-02-22 DIAGNOSIS — B029 Zoster without complications: Secondary | ICD-10-CM | POA: Insufficient documentation

## 2015-02-22 DIAGNOSIS — Z1239 Encounter for other screening for malignant neoplasm of breast: Secondary | ICD-10-CM | POA: Diagnosis not present

## 2015-02-22 DIAGNOSIS — Z13220 Encounter for screening for lipoid disorders: Secondary | ICD-10-CM | POA: Insufficient documentation

## 2015-02-22 NOTE — Assessment & Plan Note (Signed)
Recent episode of shingles. Pain now well controlled with prn ibuprofen. Will continue.

## 2015-02-22 NOTE — Patient Instructions (Signed)
Labs today.  Follow up 6 months. 

## 2015-02-22 NOTE — Progress Notes (Signed)
Pre visit review using our clinic review tool, if applicable. No additional management support is needed unless otherwise documented below in the visit note. 

## 2015-02-22 NOTE — Progress Notes (Signed)
   Subjective:    Patient ID: Ashley Lloyd, female    DOB: 25-Mar-1951, 64 y.o.   MRN: [id number]  HPI  64YO female presents to establish care.  Recently treated for shingles. Continues to have pain, worse at night. Treated with Valtrex and Percocet initially. Now only taking prn Ibuprofen with relief of symptoms.  No other concerns today. Very active at home. Follows healthy diet. Has not recently had mammogram.   Past medical, surgical, family and social history per today's encounter.  Review of Systems  Constitutional: Negative for fever, chills, appetite change, fatigue and unexpected weight change.  Eyes: Negative for visual disturbance.  Respiratory: Negative for shortness of breath.   Cardiovascular: Negative for chest pain and leg swelling.  Gastrointestinal: Negative for nausea, vomiting, abdominal pain, diarrhea and constipation.  Musculoskeletal: Negative for myalgias and arthralgias.  Skin: Negative for color change and rash.  Hematological: Negative for adenopathy. Does not bruise/bleed easily.  Psychiatric/Behavioral: Negative for suicidal ideas, sleep disturbance and dysphoric mood. The patient is not nervous/anxious.        Objective:    BP 128/60 mmHg  Pulse 64  Temp(Src) 98 F (36.7 C) (Oral)  Resp 14  Ht 4\' 11"  (1.499 m)  Wt 127 lb 6.4 oz (57.788 kg)  BMI 25.72 kg/m2  SpO2 97% Physical Exam  Constitutional: She is oriented to person, place, and time. She appears well-developed and well-nourished. No distress.  HENT:  Head: Normocephalic and atraumatic.  Right Ear: External ear normal.  Left Ear: External ear normal.  Nose: Nose normal.  Mouth/Throat: Oropharynx is clear and moist. No oropharyngeal exudate.  Eyes: Conjunctivae and EOM are normal. Pupils are equal, round, and reactive to light. Right eye exhibits no discharge.  Neck: Normal range of motion. Neck supple. No thyromegaly present.  Cardiovascular: Normal rate, regular rhythm, normal heart  sounds and intact distal pulses.  Exam reveals no gallop and no friction rub.   No murmur heard. Pulmonary/Chest: Effort normal. No respiratory distress. She has no wheezes. She has no rales.  Abdominal: Soft. Bowel sounds are normal. She exhibits no distension and no mass. There is no tenderness. There is no rebound and no guarding.  Musculoskeletal: Normal range of motion. She exhibits no edema or tenderness.  Lymphadenopathy:    She has no cervical adenopathy.  Neurological: She is alert and oriented to person, place, and time. No cranial nerve deficit. Coordination normal.  Skin: Skin is warm and dry. Rash noted. Rash is maculopapular. She is not diaphoretic. No erythema. No pallor.     Psychiatric: She has a normal mood and affect. Her behavior is normal. Judgment and thought content normal.          Assessment & Plan:   Problem List Items Addressed This Visit      Unprioritized   Screening for breast cancer    Encouraged pt to get screening mammogram. Mammogram ordered.      Relevant Orders   MM Digital Screening   Screening, lipid    Will check lipids with labs today.      Relevant Orders   Lipid panel   Shingles - Primary    Recent episode of shingles. Pain now well controlled with prn ibuprofen. Will continue.          Return in about 6 months (around 08/24/2015) for Physical.

## 2015-02-22 NOTE — Assessment & Plan Note (Signed)
Encouraged pt to get screening mammogram. Mammogram ordered.

## 2015-02-22 NOTE — Assessment & Plan Note (Signed)
Will check lipids with labs today.  

## 2015-02-23 LAB — LIPID PANEL
Cholesterol: 232 mg/dL — ABNORMAL HIGH (ref 0–200)
HDL: 73 mg/dL (ref >39.00)
LDL CALC: 141 mg/dL — AB (ref 0–99)
NONHDL: 159
Total CHOL/HDL Ratio: 3
Triglycerides: 92 mg/dL (ref 0.0–149.0)
VLDL: 18.4 mg/dL (ref 0.0–40.0)

## 2015-02-26 ENCOUNTER — Encounter: Payer: Self-pay | Admitting: *Deleted

## 2015-11-13 ENCOUNTER — Encounter: Payer: Self-pay | Admitting: Internal Medicine

## 2015-11-13 ENCOUNTER — Ambulatory Visit (INDEPENDENT_AMBULATORY_CARE_PROVIDER_SITE_OTHER): Payer: BLUE CROSS/BLUE SHIELD | Admitting: Internal Medicine

## 2015-11-13 ENCOUNTER — Ambulatory Visit: Payer: BLUE CROSS/BLUE SHIELD | Admitting: Internal Medicine

## 2015-11-13 VITALS — BP 131/71 | HR 69 | Temp 98.2°F | Ht 59.0 in | Wt 125.0 lb

## 2015-11-13 DIAGNOSIS — H109 Unspecified conjunctivitis: Secondary | ICD-10-CM | POA: Diagnosis not present

## 2015-11-13 DIAGNOSIS — R059 Cough, unspecified: Secondary | ICD-10-CM

## 2015-11-13 DIAGNOSIS — R05 Cough: Secondary | ICD-10-CM | POA: Diagnosis not present

## 2015-11-13 MED ORDER — POLYMYXIN B-TRIMETHOPRIM 10000-0.1 UNIT/ML-% OP SOLN: 1.0000 [drp] | OPHTHALMIC | Status: DC

## 2015-11-13 MED ORDER — HYDROCODONE-HOMATROPINE 5-1.5 MG/5ML PO SYRP: 5.0000 mL | ORAL_SOLUTION | Freq: Four times a day (QID) | ORAL | Status: DC | PRN

## 2015-11-13 MED ORDER — AZITHROMYCIN 250 MG PO TABS: ORAL_TABLET | ORAL | Status: DC

## 2015-11-13 NOTE — Progress Notes (Signed)
Pre visit review using our clinic review tool, if applicable. No additional management support is needed unless otherwise documented below in the visit note. 

## 2015-11-13 NOTE — Assessment & Plan Note (Signed)
Cough symptoms x nearly 4 weeks. Symptoms most consistent with Pertussis. Will start Azithromycin to prevent transmission to other. Discussed natural course of Pertussis with prolonged cough up to 3 months. Hycodan for cough. Discussed risks of this medication. Follow up if symptoms are not improving.

## 2015-11-13 NOTE — Progress Notes (Signed)
Subjective:    Patient ID: Ashley Lloyd, female    DOB: 1951/05/21, 65 y.o.   MRN: [id number]  HPI  65YO female presents for acute visit.  Started 2-3 weeks ago with congestion, cough. No fever, or chills. Has sore throat. Mild dyspnea. Watery eyes.  Episodes of cough are severe.  Cough occasionally productive. Taking Zyrtec and Mucinex and Theraflu with no improvement. Several family members sick.    Wt Readings from Last 3 Encounters:  11/13/15 125 lb (56.7 kg)  02/22/15 127 lb 6.4 oz (57.788 kg)  02/04/13 130 lb (58.968 kg)   BP Readings from Last 3 Encounters:  11/13/15 131/71  02/22/15 128/60  02/06/15 144/77    Past Medical History  Diagnosis Date  . Shingles   . Cancer [facility]) 2000    left breast cancer, s/p lumpectomy and XRT   Family History  Problem Relation Age of Onset  . Arthritis Mother   . Hypertension Mother   . Kidney disease Mother   . Cancer Mother     breast cancer, kidney cancer  . Arthritis Father   . Heart disease Father   . Cancer Maternal Aunt     breast cancer  . Fibromyalgia Sister   . Cancer Brother     pancreatic cancer   Past Surgical History  Procedure Laterality Date  . Shoulder surgery Left 2001    rotator cuff  . Breast surgery    . Breast lumpectomy    . Vaginal delivery      4x   Social History   Social History  . Marital Status: Married    Spouse Name: N/A  . Number of Children: N/A  . Years of Education: N/A   Social History Main Topics  . Smoking status: Never Smoker   . Smokeless tobacco: Never Used  . Alcohol Use: No  . Drug Use: No  . Sexual Activity: Yes    Birth Control/ Protection: Post-menopausal   Other Topics Concern  . None   Social History Narrative   Lives in [place] with husband.      Work - homemaker      Diet - regular      Exercise - gardening, very active around home    Review of Systems  Constitutional: Positive for fatigue. Negative for fever, chills and unexpected weight  change.  HENT: Positive for congestion, postnasal drip, rhinorrhea, sinus pressure and sore throat. Negative for ear discharge, ear pain, facial swelling, hearing loss, mouth sores, nosebleeds, sneezing, tinnitus, trouble swallowing and voice change.   Eyes: Positive for pain, discharge and redness. Negative for visual disturbance.  Respiratory: Positive for cough, shortness of breath and wheezing. Negative for chest tightness and stridor.   Cardiovascular: Negative for chest pain, palpitations and leg swelling.  Musculoskeletal: Negative for myalgias, arthralgias, neck pain and neck stiffness.  Skin: Negative for color change and rash.  Neurological: Negative for dizziness, weakness, light-headedness and headaches.  Hematological: Negative for adenopathy.       Objective:    BP 131/71 mmHg  Pulse 69  Temp(Src) 98.2 F (36.8 C) (Oral)  Ht 4\' 11"  (1.499 m)  Wt 125 lb (56.7 kg)  BMI 25.23 kg/m2  SpO2 98% Physical Exam  Constitutional: She is oriented to person, place, and time. She appears well-developed and well-nourished. No distress.  HENT:  Head: Normocephalic and atraumatic.  Right Ear: External ear normal.  Left Ear: External ear normal.  Nose: Nose normal.  Mouth/Throat: Oropharynx is clear and  moist. No oropharyngeal exudate or posterior oropharyngeal erythema.    Eyes: Conjunctivae are normal. Pupils are equal, round, and reactive to light. Right eye exhibits no discharge. Left eye exhibits no discharge. No scleral icterus.  Neck: Normal range of motion. Neck supple. No tracheal deviation present. No thyromegaly present.  Cardiovascular: Normal rate, regular rhythm, normal heart sounds and intact distal pulses.  Exam reveals no gallop and no friction rub.   No murmur heard. Pulmonary/Chest: Effort normal and breath sounds normal. No accessory muscle usage. No tachypnea. No respiratory distress. She has no decreased breath sounds. She has no wheezes. She has no rhonchi. She  has no rales. She exhibits no tenderness.  Musculoskeletal: Normal range of motion. She exhibits no edema or tenderness.  Lymphadenopathy:    She has no cervical adenopathy.  Neurological: She is alert and oriented to person, place, and time. No cranial nerve deficit. She exhibits normal muscle tone. Coordination normal.  Skin: Skin is warm and dry. No rash noted. She is not diaphoretic. No erythema. No pallor.  Psychiatric: She has a normal mood and affect. Her behavior is normal. Judgment and thought content normal.          Assessment & Plan:   Problem List Items Addressed This Visit      Unprioritized   Bilateral conjunctivitis    Bilateral conjunctivitis likely related to viral infection, however given new purulent exudate, will add empiric Polytrim to cover bacterial conjunctivitis. Follow up prn if symptoms not improving.      Relevant Medications   trimethoprim-polymyxin b (POLYTRIM) ophthalmic solution   Cough - Primary    Cough symptoms x nearly 4 weeks. Symptoms most consistent with Pertussis. Will start Azithromycin to prevent transmission to other. Discussed natural course of Pertussis with prolonged cough up to 3 months. Hycodan for cough. Discussed risks of this medication. Follow up if symptoms are not improving.      Relevant Medications   azithromycin (ZITHROMAX Z-PAK) 250 MG tablet   HYDROcodone-homatropine (HYCODAN) 5-1.5 MG/5ML syrup       Return if symptoms worsen or fail to improve.

## 2015-11-13 NOTE — Assessment & Plan Note (Signed)
Bilateral conjunctivitis likely related to viral infection, however given new purulent exudate, will add empiric Polytrim to cover bacterial conjunctivitis. Follow up prn if symptoms not improving.

## 2015-11-13 NOTE — Patient Instructions (Signed)
Start Azithromycin.  Use Codeine based syrup as needed for cough.  Start Polytrim to eyes to help with infection.  Call if any worsening symptoms, fever, chest pain or other concerns.

## 2016-10-08 DIAGNOSIS — Z23 Encounter for immunization: Secondary | ICD-10-CM | POA: Diagnosis not present

## 2016-10-08 DIAGNOSIS — E559 Vitamin D deficiency, unspecified: Secondary | ICD-10-CM | POA: Diagnosis not present

## 2016-10-08 DIAGNOSIS — E785 Hyperlipidemia, unspecified: Secondary | ICD-10-CM | POA: Diagnosis not present

## 2016-12-02 DIAGNOSIS — C50919 Malignant neoplasm of unspecified site of unspecified female breast: Secondary | ICD-10-CM | POA: Diagnosis not present

## 2016-12-02 DIAGNOSIS — E785 Hyperlipidemia, unspecified: Secondary | ICD-10-CM | POA: Diagnosis not present

## 2017-10-14 DIAGNOSIS — Z23 Encounter for immunization: Secondary | ICD-10-CM | POA: Diagnosis not present

## 2018-03-24 DIAGNOSIS — X32XXXA Exposure to sunlight, initial encounter: Secondary | ICD-10-CM | POA: Diagnosis not present

## 2018-03-24 DIAGNOSIS — L820 Inflamed seborrheic keratosis: Secondary | ICD-10-CM | POA: Diagnosis not present

## 2018-03-24 DIAGNOSIS — L570 Actinic keratosis: Secondary | ICD-10-CM | POA: Diagnosis not present

## 2018-06-21 DIAGNOSIS — R5383 Other fatigue: Secondary | ICD-10-CM | POA: Diagnosis not present

## 2018-06-21 DIAGNOSIS — C50919 Malignant neoplasm of unspecified site of unspecified female breast: Secondary | ICD-10-CM | POA: Diagnosis not present

## 2018-06-21 DIAGNOSIS — E785 Hyperlipidemia, unspecified: Secondary | ICD-10-CM | POA: Diagnosis not present

## 2018-08-12 DIAGNOSIS — E785 Hyperlipidemia, unspecified: Secondary | ICD-10-CM | POA: Diagnosis not present

## 2018-08-12 DIAGNOSIS — E559 Vitamin D deficiency, unspecified: Secondary | ICD-10-CM | POA: Diagnosis not present

## 2018-08-12 DIAGNOSIS — Z23 Encounter for immunization: Secondary | ICD-10-CM | POA: Diagnosis not present

## 2018-08-12 DIAGNOSIS — R5383 Other fatigue: Secondary | ICD-10-CM | POA: Diagnosis not present

## 2018-09-21 ENCOUNTER — Other Ambulatory Visit (HOSPITAL_COMMUNITY): Payer: Self-pay | Admitting: Internal Medicine

## 2018-09-21 ENCOUNTER — Ambulatory Visit (HOSPITAL_COMMUNITY)
Admission: RE | Admit: 2018-09-21 | Discharge: 2018-09-21 | Disposition: A | Discharge status: Home / Self Care | Payer: PPO | Source: Ambulatory Visit | Attending: Internal Medicine | Admitting: Internal Medicine

## 2018-09-21 DIAGNOSIS — M545 Low back pain, unspecified: Secondary | ICD-10-CM

## 2018-09-21 DIAGNOSIS — M4316 Spondylolisthesis, lumbar region: Secondary | ICD-10-CM | POA: Insufficient documentation

## 2018-09-21 DIAGNOSIS — M25551 Pain in right hip: Secondary | ICD-10-CM | POA: Diagnosis not present

## 2018-09-21 DIAGNOSIS — Z1231 Encounter for screening mammogram for malignant neoplasm of breast: Secondary | ICD-10-CM

## 2018-09-21 DIAGNOSIS — E559 Vitamin D deficiency, unspecified: Secondary | ICD-10-CM | POA: Diagnosis not present

## 2018-09-21 DIAGNOSIS — M47816 Spondylosis without myelopathy or radiculopathy, lumbar region: Secondary | ICD-10-CM | POA: Insufficient documentation

## 2018-09-24 ENCOUNTER — Ambulatory Visit (HOSPITAL_COMMUNITY): Payer: PPO

## 2018-09-27 ENCOUNTER — Ambulatory Visit (HOSPITAL_COMMUNITY): Payer: PPO

## 2018-12-02 ENCOUNTER — Other Ambulatory Visit (HOSPITAL_COMMUNITY): Payer: Self-pay | Admitting: Internal Medicine

## 2018-12-02 DIAGNOSIS — Z1231 Encounter for screening mammogram for malignant neoplasm of breast: Secondary | ICD-10-CM

## 2018-12-09 ENCOUNTER — Encounter (HOSPITAL_COMMUNITY): Payer: Self-pay

## 2018-12-09 ENCOUNTER — Ambulatory Visit (HOSPITAL_COMMUNITY)
Admission: RE | Admit: 2018-12-09 | Discharge: 2018-12-09 | Disposition: A | Discharge status: Home / Self Care | Payer: PPO | Source: Ambulatory Visit | Attending: Internal Medicine | Admitting: Internal Medicine

## 2018-12-09 DIAGNOSIS — Z1231 Encounter for screening mammogram for malignant neoplasm of breast: Secondary | ICD-10-CM | POA: Diagnosis not present

## 2019-01-20 DIAGNOSIS — M545 Low back pain: Secondary | ICD-10-CM | POA: Diagnosis not present

## 2019-01-20 DIAGNOSIS — E559 Vitamin D deficiency, unspecified: Secondary | ICD-10-CM | POA: Diagnosis not present

## 2019-01-20 DIAGNOSIS — E785 Hyperlipidemia, unspecified: Secondary | ICD-10-CM | POA: Diagnosis not present

## 2019-01-20 DIAGNOSIS — R5383 Other fatigue: Secondary | ICD-10-CM | POA: Diagnosis not present

## 2019-04-06 DIAGNOSIS — E559 Vitamin D deficiency, unspecified: Secondary | ICD-10-CM | POA: Diagnosis not present

## 2019-04-06 DIAGNOSIS — E785 Hyperlipidemia, unspecified: Secondary | ICD-10-CM | POA: Diagnosis not present

## 2019-04-06 DIAGNOSIS — M545 Low back pain: Secondary | ICD-10-CM | POA: Diagnosis not present

## 2019-05-23 ENCOUNTER — Ambulatory Visit (INDEPENDENT_AMBULATORY_CARE_PROVIDER_SITE_OTHER): Payer: PPO | Admitting: Internal Medicine

## 2019-06-27 DIAGNOSIS — M545 Low back pain: Secondary | ICD-10-CM | POA: Diagnosis not present

## 2019-07-13 ENCOUNTER — Other Ambulatory Visit: Payer: Self-pay

## 2019-07-13 ENCOUNTER — Ambulatory Visit (INDEPENDENT_AMBULATORY_CARE_PROVIDER_SITE_OTHER): Payer: PPO | Admitting: Internal Medicine

## 2019-07-13 ENCOUNTER — Encounter (INDEPENDENT_AMBULATORY_CARE_PROVIDER_SITE_OTHER): Payer: Self-pay | Admitting: Internal Medicine

## 2019-07-13 VITALS — BP 130/60 | HR 60 | Ht 60.0 in | Wt 133.8 lb

## 2019-07-13 DIAGNOSIS — E559 Vitamin D deficiency, unspecified: Secondary | ICD-10-CM | POA: Diagnosis not present

## 2019-07-13 DIAGNOSIS — Z13820 Encounter for screening for osteoporosis: Secondary | ICD-10-CM

## 2019-07-13 DIAGNOSIS — E785 Hyperlipidemia, unspecified: Secondary | ICD-10-CM

## 2019-07-13 NOTE — Progress Notes (Signed)
     Subjective:  Patient ID: Ashley Lloyd, female    DOB: 1951/08/28  Age: 68 y.o. MRN: [id number]  CC: This lady comes in for follow-up of her multiple medical problems including hyperlipidemia, vitamin D deficiency, history of breast cancer.  HPI She is doing reasonably well and when I saw her 2 weeks ago,, she was having acute low back pain.  This is now improved significantly although not completely resolved.  She still denies having any radiation of the pain down her legs or any difficulty with walking. Her hyperlipidemia has not required statin therapy. We will increase vitamin D3 supplementation to 5000 units daily and she has been taking this faithfully.   Past Medical History:  Diagnosis Date  . Cancer [facility]) 2000   left breast cancer, s/p lumpectomy and XRT  . HLD (hyperlipidemia)   . Shingles   . Vitamin D deficiency disease      Social History   Social History Narrative   Lives in [place] with husband.      Work - homemaker      Diet - regular      Exercise - gardening, very active around home   Social History   Substance and Sexual Activity  Alcohol Use No    Social History   Tobacco Use  Smoking Status Never Smoker  Smokeless Tobacco Never Used    Married   Current Meds  Medication Sig  . Cholecalciferol (VITAMIN D-3) 125 MCG (5000 UT) TABS Take 5,000 Units by mouth daily at 12 noon.       Objective:   Today's Vitals: BP 130/60   Pulse 60   Ht 5' (1.524 m)   Wt 133 lb 12.8 oz (60.7 kg)   BMI 26.13 kg/m  Vitals with BMI 07/13/2019 11/13/2015 02/22/2015  Height 5\' 0"  4\' 11"  4\' 11"   Weight 133 lbs 13 oz 125 lbs 127 lbs 6 oz  BMI 26.13 [id number] 0000000  Systolic [id number] 0000000  Diastolic 60 71 60  Pulse 60 69 64       Physical Exam She looks systemically well.  No new physical findings.   Assessment     1. Vitamin D deficiency disease   2. Hyperlipidemia, unspecified hyperlipidemia type   3. Screening for osteoporosis        Plan 1. She will continue with vitamin D3 supplementation. 2. She finally agreed for bone density scanning so we will set her up for that. 3. I did discuss colon cancer screening today and she is hesitant but she will think about it. 4. Blood work was taken for complete metabolic panel and vitamin D levels. 5. Further recommendations will depend on all these results and I will see her in about 3 months time for an annual physical exam. 6. I spent 25 minutes with this patient face-to-face, more than 50% of the time was involved in discussing the importance of colon cancer screening as well as bone density scanning.   Tests Ordered:   Orders Placed This Encounter  Procedures  . DG Bone Density  . COMPLETE METABOLIC PANEL WITH GFR  . VITAMIN D 25 Hydroxy (Vit-D Deficiency, Fractures)     Doree Albee, MD

## 2019-07-14 ENCOUNTER — Encounter (INDEPENDENT_AMBULATORY_CARE_PROVIDER_SITE_OTHER): Payer: Self-pay | Admitting: Internal Medicine

## 2019-07-14 LAB — COMPLETE METABOLIC PANEL WITH GFR
AG Ratio: 1.6 (calc) (ref 1.0–2.5)
ALT: 15 U/L (ref 6–29)
AST: 19 U/L (ref 10–35)
Albumin: 3.9 g/dL (ref 3.6–5.1)
Alkaline phosphatase (APISO): 58 U/L (ref 37–153)
BUN: 17 mg/dL (ref 7–25)
CO2: 24 mmol/L (ref 20–32)
Calcium: 9.3 mg/dL (ref 8.6–10.4)
Chloride: 106 mmol/L (ref 98–110)
Creat: 0.86 mg/dL (ref 0.50–0.99)
GFR, Est African American: 81 mL/min/{1.73_m2} (ref >=60)
GFR, Est Non African American: 70 mL/min/{1.73_m2} (ref >=60)
Globulin: 2.5 g/dL (calc) (ref 1.9–3.7)
Glucose, Bld: 100 mg/dL — ABNORMAL HIGH (ref 65–99)
Potassium: 4.7 mmol/L (ref 3.5–5.3)
Sodium: 142 mmol/L (ref 135–146)
Total Bilirubin: 0.3 mg/dL (ref 0.2–1.2)
Total Protein: 6.4 g/dL (ref 6.1–8.1)

## 2019-07-14 LAB — VITAMIN D 25 HYDROXY (VIT D DEFICIENCY, FRACTURES): Vit D, 25-Hydroxy: 78 ng/mL (ref 30–100)

## 2019-07-14 NOTE — Progress Notes (Signed)
I spoke to the patient's husband and informed him of his wife normal results and continue with the same dose of vitamin D3. Follow-up as scheduled.

## 2019-10-19 ENCOUNTER — Ambulatory Visit (INDEPENDENT_AMBULATORY_CARE_PROVIDER_SITE_OTHER): Payer: PPO | Admitting: Internal Medicine

## 2019-10-19 ENCOUNTER — Encounter (INDEPENDENT_AMBULATORY_CARE_PROVIDER_SITE_OTHER): Payer: Self-pay | Admitting: Internal Medicine

## 2019-10-19 ENCOUNTER — Other Ambulatory Visit: Payer: Self-pay

## 2019-10-19 VITALS — BP 130/60 | HR 72 | Ht 60.0 in | Wt 137.2 lb

## 2019-10-19 DIAGNOSIS — G8929 Other chronic pain: Secondary | ICD-10-CM | POA: Diagnosis not present

## 2019-10-19 DIAGNOSIS — E559 Vitamin D deficiency, unspecified: Secondary | ICD-10-CM

## 2019-10-19 DIAGNOSIS — M545 Low back pain, unspecified: Secondary | ICD-10-CM

## 2019-10-19 DIAGNOSIS — E782 Mixed hyperlipidemia: Secondary | ICD-10-CM | POA: Diagnosis not present

## 2019-10-19 DIAGNOSIS — Z13820 Encounter for screening for osteoporosis: Secondary | ICD-10-CM

## 2019-10-19 NOTE — Progress Notes (Signed)
Metrics: Intervention Frequency ACO  Documented Smoking Status Yearly  Screened one or more times in 24 months  Cessation Counseling or  Active cessation medication Past 24 months  Past 24 months   Guideline developer: UpToDate (See UpToDate for funding source) Date Released: 2014       Wellness Office Visit  Subjective:  Patient ID: Ashley Lloyd, female    DOB: 29-May-1951  Age: 68 y.o. MRN: [id number]  CC: This lady comes in for follow-up of vitamin D deficiency, hyperlipidemia and chronic low back pain. HPI She is doing reasonably well and continues with vitamin D3 supplementation.  Her vitamin D levels were good range last visit. I thought we had scheduled her for bone density scan but this did not happen so we will do it again.  Past Medical History:  Diagnosis Date  . Cancer [facility]) 2000   left breast cancer, s/p lumpectomy and XRT  . HLD (hyperlipidemia)   . Shingles   . Vitamin D deficiency disease       Family History  Problem Relation Age of Onset  . Arthritis Mother   . Hypertension Mother   . Kidney disease Mother   . Cancer Mother        breast cancer, kidney cancer  . Arthritis Father   . Heart disease Father   . Cancer Maternal Aunt        breast cancer  . Fibromyalgia Sister   . Cancer Brother        pancreatic cancer    Social History   Social History Narrative   Lives in [place] with husband.      Work - homemaker      Diet - regular      Exercise - gardening, very active around home   Social History   Tobacco Use  . Smoking status: Never Smoker  . Smokeless tobacco: Never Used  Substance Use Topics  . Alcohol use: No    Current Meds  Medication Sig  . Cholecalciferol (VITAMIN D-3) 125 MCG (5000 UT) TABS Take 5,000 Units by mouth daily at 12 noon.      Objective:   Today's Vitals: BP 130/60   Pulse 72   Ht 5' (1.524 m)   Wt 137 lb 3.2 oz (62.2 kg)   BMI 26.80 kg/m  Vitals with BMI 10/19/2019 07/13/2019 11/13/2015   Height 5\' 0"  5\' 0"  4\' 11"   Weight 137 lbs 3 oz 133 lbs 13 oz 125 lbs  BMI 26.8 123456 [id number]  Systolic [id number]  Diastolic 60 60 71  Pulse 72 60 69     Physical Exam  She looks systemically well.  No new physical findings today.     Assessment   1. Vitamin D deficiency disease   2. Mixed hyperlipidemia   3. Screening for osteoporosis   4. Chronic midline low back pain, unspecified whether sciatica present       Tests ordered Orders Placed This Encounter  Procedures  . DG Bone Density     Plan: 1. She will continue with all medications especially vitamin D3 supplementation. 2. I will arrange for her to have a bone density scan. 3. Follow-up in about 3 months for an annual physical exam.   No orders of the defined types were placed in this encounter.   Doree Albee, MD

## 2019-11-02 ENCOUNTER — Ambulatory Visit (HOSPITAL_COMMUNITY)
Admission: RE | Admit: 2019-11-02 | Discharge: 2019-11-02 | Disposition: A | Discharge status: Home / Self Care | Payer: PPO | Source: Ambulatory Visit | Attending: Internal Medicine | Admitting: Internal Medicine

## 2019-11-02 ENCOUNTER — Encounter (HOSPITAL_COMMUNITY): Payer: Self-pay

## 2019-11-02 ENCOUNTER — Other Ambulatory Visit (HOSPITAL_COMMUNITY): Payer: PPO

## 2019-11-02 ENCOUNTER — Other Ambulatory Visit: Payer: Self-pay

## 2019-11-02 DIAGNOSIS — Z853 Personal history of malignant neoplasm of breast: Secondary | ICD-10-CM | POA: Insufficient documentation

## 2019-11-02 DIAGNOSIS — Z780 Asymptomatic menopausal state: Secondary | ICD-10-CM | POA: Diagnosis not present

## 2019-11-02 DIAGNOSIS — Z13820 Encounter for screening for osteoporosis: Secondary | ICD-10-CM | POA: Diagnosis not present

## 2019-11-02 DIAGNOSIS — M8589 Other specified disorders of bone density and structure, multiple sites: Secondary | ICD-10-CM | POA: Diagnosis not present

## 2020-01-17 ENCOUNTER — Ambulatory Visit (INDEPENDENT_AMBULATORY_CARE_PROVIDER_SITE_OTHER): Payer: PPO | Admitting: Internal Medicine

## 2020-01-17 ENCOUNTER — Encounter (INDEPENDENT_AMBULATORY_CARE_PROVIDER_SITE_OTHER): Payer: Self-pay | Admitting: Internal Medicine

## 2020-01-17 ENCOUNTER — Other Ambulatory Visit: Payer: Self-pay

## 2020-01-17 VITALS — BP 120/58 | HR 58 | Temp 97.7°F | Ht 60.0 in | Wt 138.2 lb

## 2020-01-17 DIAGNOSIS — E782 Mixed hyperlipidemia: Secondary | ICD-10-CM

## 2020-01-17 DIAGNOSIS — E559 Vitamin D deficiency, unspecified: Secondary | ICD-10-CM

## 2020-01-17 DIAGNOSIS — Z131 Encounter for screening for diabetes mellitus: Secondary | ICD-10-CM

## 2020-01-17 DIAGNOSIS — Z1159 Encounter for screening for other viral diseases: Secondary | ICD-10-CM | POA: Diagnosis not present

## 2020-01-17 DIAGNOSIS — Z0001 Encounter for general adult medical examination with abnormal findings: Secondary | ICD-10-CM | POA: Diagnosis not present

## 2020-01-17 NOTE — Progress Notes (Signed)
Chief Complaint: This 69 year old lady comes in for an annual physical exam and to address her chronic conditions which are described below. HPI: She is doing reasonably well.  She does have vitamin D deficiency and takes medication for this. She does have a history of breast cancer but has had no recurrence.  Past Medical History:  Diagnosis Date  . Cancer [facility]) 2000   left breast cancer, s/p lumpectomy and XRT  . HLD (hyperlipidemia)   . Shingles   . Vitamin D deficiency disease    Past Surgical History:  Procedure Laterality Date  . BREAST LUMPECTOMY    . BREAST SURGERY    . SHOULDER SURGERY Left 2001   rotator cuff  . VAGINAL DELIVERY     4x     Social History   Social History Narrative   Lives in [place] with husband.Married for 52 years.      Work - homemaker      Diet - regular      Exercise - gardening, very active around home    Social History   Tobacco Use  . Smoking status: Never Smoker  . Smokeless tobacco: Never Used  Substance Use Topics  . Alcohol use: No      Allergies:  Allergies  Allergen Reactions  . Celecoxib Swelling and Rash    CELEBREX REACTION: Rash , swelling     Current Meds  Medication Sig  . Ascorbic Acid (VITAMIN C) 1000 MG tablet Take 1,000 mg by mouth daily.  . Cholecalciferol (VITAMIN D-3) 125 MCG (5000 UT) TABS Take 5,000 Units by mouth daily at 12 noon.  Marland Kitchen glucosamine-chondroitin 500-400 MG tablet Take 1 tablet by mouth 3 (three) times daily.       [id number] from the symptoms mentioned above,there are no other symptoms referable to all systems reviewed.  Physical Exam: Blood pressure (!) 120/58, pulse (!) 58, temperature 97.7 F (36.5 C), temperature source Temporal, height 5' (1.524 m), weight 138 lb 3.2 oz (62.7 kg), SpO2 98 %. Vitals with BMI 01/17/2020 10/19/2019 07/13/2019  Height 5\' 0"  5\' 0"  5\' 0"   Weight 138 lbs 3 oz 137 lbs 3 oz 133 lbs 13 oz  BMI 26.99 0000000 [id number]  Systolic [id number]  Diastolic  58 60 60  Pulse 58 72 60      She looks systemically well.  Blood pressure stable. General: Alert, cooperative, and appears to be the stated age.No pallor.  No jaundice.  No clubbing. Head: Normocephalic Eyes: Sclera white, pupils equal and reactive to light, red reflex x 2,  Ears: Normal bilaterally Oral cavity: Lips, mucosa, and tongue normal: Teeth and gums normal Neck: No adenopathy, supple, symmetrical, trachea midline, and thyroid does not appear enlarged Respiratory: Clear to auscultation bilaterally.No wheezing, crackles or bronchial breathing. Cardiovascular: Heart sounds are present and appear to be normal without murmurs or added sounds.  No carotid bruits.  Peripheral pulses are present and equal bilaterally.: Gastrointestinal:positive bowel sounds, no hepatosplenomegaly.  No masses felt.No tenderness. Skin: Clear, No rashes noted.No worrisome skin lesions seen. Neurological: Grossly intact without focal findings, cranial nerves II through XII intact, muscle strength equal bilaterally Musculoskeletal: No acute joint abnormalities noted.Full range of movement noted with joints. Psychiatric: Affect appropriate, non-anxious.    Assessment  1. Vitamin D deficiency disease   2. Mixed hyperlipidemia   3. Encounter for general adult medical examination with abnormal findings   4. Encounter for hepatitis C screening test for low risk patient   5. Screening  for diabetes mellitus     Tests Ordered:   Orders Placed This Encounter  Procedures  . Hepatitis C antibody  . CBC  . COMPLETE METABOLIC PANEL WITH GFR  . Lipid panel  . Hemoglobin A1c  . TSH  . VITAMIN D 25 Hydroxy (Vit-D Deficiency, Fractures)     Plan  1. Blood work is ordered above. 2. She will continue with all supplements that she is on and further recommendations will depend on blood results. 3. Follow-up with Judson Roch in about 3 months time.     No orders of the defined types were placed in this  encounter.    Kamorah Nevils C Braylynn Lewing   01/17/2020, 9:37 AM

## 2020-01-18 LAB — CBC
HCT: 38.6 % (ref 35.0–45.0)
Hemoglobin: 12.7 g/dL (ref 11.7–15.5)
MCH: 30.6 pg (ref 27.0–33.0)
MCHC: 32.9 g/dL (ref 32.0–36.0)
MCV: 93 fL (ref 80.0–100.0)
MPV: 9.2 fL (ref 7.5–12.5)
Platelets: 343 10*3/uL (ref 140–400)
RBC: 4.15 10*6/uL (ref 3.80–5.10)
RDW: 13.3 % (ref 11.0–15.0)
WBC: 7.1 10*3/uL (ref 3.8–10.8)

## 2020-01-18 LAB — COMPLETE METABOLIC PANEL WITH GFR
AG Ratio: 1.5 (calc) (ref 1.0–2.5)
ALT: 13 U/L (ref 6–29)
AST: 19 U/L (ref 10–35)
Albumin: 4.1 g/dL (ref 3.6–5.1)
Alkaline phosphatase (APISO): 67 U/L (ref 37–153)
BUN: 20 mg/dL (ref 7–25)
CO2: 27 mmol/L (ref 20–32)
Calcium: 9.6 mg/dL (ref 8.6–10.4)
Chloride: 103 mmol/L (ref 98–110)
Creat: 0.81 mg/dL (ref 0.50–0.99)
GFR, Est African American: 86 mL/min/{1.73_m2} (ref >=60)
GFR, Est Non African American: 75 mL/min/{1.73_m2} (ref >=60)
Globulin: 2.8 g/dL (calc) (ref 1.9–3.7)
Glucose, Bld: 100 mg/dL — ABNORMAL HIGH (ref 65–99)
Potassium: 4.5 mmol/L (ref 3.5–5.3)
Sodium: 140 mmol/L (ref 135–146)
Total Bilirubin: 0.4 mg/dL (ref 0.2–1.2)
Total Protein: 6.9 g/dL (ref 6.1–8.1)

## 2020-01-18 LAB — LIPID PANEL
Cholesterol: 244 mg/dL — ABNORMAL HIGH (ref <200)
HDL: 84 mg/dL (ref >=50)
LDL Cholesterol (Calc): 144 mg/dL (calc) — ABNORMAL HIGH
Non-HDL Cholesterol (Calc): 160 mg/dL (calc) — ABNORMAL HIGH (ref <130)
Total CHOL/HDL Ratio: 2.9 (calc) (ref <5.0)
Triglycerides: 63 mg/dL (ref <150)

## 2020-01-18 LAB — HEMOGLOBIN A1C
Hgb A1c MFr Bld: 5.9 % of total Hgb — ABNORMAL HIGH (ref <5.7)
Mean Plasma Glucose: 123 (calc)
eAG (mmol/L): 6.8 (calc)

## 2020-01-18 LAB — VITAMIN D 25 HYDROXY (VIT D DEFICIENCY, FRACTURES): Vit D, 25-Hydroxy: 70 ng/mL (ref 30–100)

## 2020-01-18 LAB — TSH: TSH: 3.91 mIU/L (ref 0.40–4.50)

## 2020-01-18 LAB — HEPATITIS C ANTIBODY
Hepatitis C Ab: NONREACTIVE
SIGNAL TO CUT-OFF: 0.01 (ref <1.00)

## 2020-01-19 ENCOUNTER — Encounter (INDEPENDENT_AMBULATORY_CARE_PROVIDER_SITE_OTHER): Payer: Self-pay | Admitting: Internal Medicine

## 2020-04-24 ENCOUNTER — Ambulatory Visit (INDEPENDENT_AMBULATORY_CARE_PROVIDER_SITE_OTHER): Payer: PPO | Admitting: Nurse Practitioner

## 2020-04-24 ENCOUNTER — Other Ambulatory Visit: Payer: Self-pay

## 2020-04-24 ENCOUNTER — Encounter (INDEPENDENT_AMBULATORY_CARE_PROVIDER_SITE_OTHER): Payer: Self-pay | Admitting: Nurse Practitioner

## 2020-04-24 VITALS — BP 100/60 | HR 60 | Temp 97.3°F | Ht 60.0 in | Wt 136.4 lb

## 2020-04-24 DIAGNOSIS — E782 Mixed hyperlipidemia: Secondary | ICD-10-CM

## 2020-04-24 DIAGNOSIS — R7303 Prediabetes: Secondary | ICD-10-CM

## 2020-04-24 DIAGNOSIS — E559 Vitamin D deficiency, unspecified: Secondary | ICD-10-CM | POA: Diagnosis not present

## 2020-04-24 NOTE — Patient Instructions (Signed)
[facility] Dietary Recommendations for Weight Loss What to Avoid . Avoid added sugars o Often added sugar can be found in processed foods such as many condiments, dry cereals, cakes, cookies, chips, crisps, crackers, candies, sweetened drinks, etc.  o Read labels and AVOID/DECREASE use of foods with the following in their ingredient list: Sugar, fructose, high fructose corn syrup, sucrose, glucose, maltose, dextrose, molasses, cane sugar, brown sugar, any type of syrup, agave nectar, etc.   . Avoid snacking in between meals . Avoid foods made with flour o If you are going to eat food made with flour, choose those made with whole-grains; and, minimize your consumption as much as is tolerable . Avoid processed foods o These foods are generally stocked in the middle of the grocery store. Focus on shopping on the perimeter of the grocery.  . Avoid Meat  o We recommend following a plant-based diet at [facility]. Thus, we recommend avoiding meat as a general rule. Consider eating beans, legumes, eggs, and/or dairy products for regular protein sources o If you plan on eating meat limit to 4 ounces of meat at a time and choose lean options such as Fish, chicken, turkey. Avoid red meat intake such as pork and/or steak What to Include . Vegetables o GREEN LEAFY VEGETABLES: Kale, spinach, mustard greens, collard greens, cabbage, broccoli, etc. o OTHER: Asparagus, cauliflower, eggplant, carrots, peas, Brussel sprouts, tomatoes, bell peppers, zucchini, beets, cucumbers, etc. . Grains, seeds, and legumes o Beans: kidney beans, black eyed peas, garbanzo beans, black beans, pinto beans, etc. o Whole, unrefined grains: brown rice, barley, bulgur, oatmeal, etc. . Healthy fats  o Avoid highly processed fats such as vegetable oil o Examples of healthy fats: avocado, olives, virgin olive oil, dark chocolate (?72% Cocoa), nuts (peanuts, almonds, walnuts, cashews, pecans, etc.) . None to Low  Intake of Animal Sources of Protein o Meat sources: chicken, turkey, salmon, tuna. Limit to 4 ounces of meat at one time. o Consider limiting dairy sources, but when choosing dairy focus on: PLAIN Greek yogurt, cottage cheese, high-protein milk . Fruit o Choose berries  When to Eat . Intermittent Fasting: o Choosing not to eat for a specific time period, but DO FOCUS ON HYDRATION when fasting o Multiple Techniques: - Time Restricted Eating: eat 3 meals in a day, each meal lasting no more than 60 minutes, no snacks between meals - 16-18 hour fast: fast for 16 to 18 hours up to 7 days a week. Often suggested to start with 2-3 nonconsecutive days per week.  . Remember the time you sleep is counted as fasting.  . Examples of eating schedule: Fast from 7:00pm-11:00am. Eat between 11:00am-7:00pm.  - 24-hour fast: fast for 24 hours up to every other day. Often suggested to start with 1 day per week . Remember the time you sleep is counted as fasting . Examples of eating schedule:  o Eating day: eat 2-3 meals on your eating day. If doing 2 meals, each meal should last no more than 90 minutes. If doing 3 meals, each meal should last no more than 60 minutes. Finish last meal by 7:00pm. o Fasting day: Fast until 7:00pm.  o IF YOU FEEL UNWELL FOR ANY REASON/IN ANY WAY WHEN FASTING, STOP FASTING BY EATING A NUTRITIOUS SNACK OR LIGHT MEAL o ALWAYS FOCUS ON HYDRATION DURING FASTS - Acceptable Hydration sources: water, broths, tea/coffee (black tea/coffee is best but using a small amount of whole-fat dairy products in coffee/tea is acceptable).  -   Poor Hydration Sources: anything with sugar or artificial sweeteners added to it  These recommendations have been developed for patients that are actively receiving medical care from either Dr. [facility] or Odessie Polzin, DNP, NP-C at [facility]. These recommendations are developed for patients with specific medical conditions and are not meant to be  distributed or used by others that are not actively receiving care from either provider listed above at [facility]. It is not appropriate to participate in the above eating plans without proper medical supervision.   Reference: Fung, J. The obesity code. [place]: [place]; 2016.   

## 2020-04-24 NOTE — Progress Notes (Signed)
Subjective:  Patient ID: Ashley Lloyd, female    DOB: [birth year]  Age: 69 y.o. MRN: [id number]  CC:  Chief Complaint  Patient presents with  . vitamin d deficiency  . Other    Pre-diabetes  . Hyperlipidemia      HPI  This pleasant 69 year old female comes in today for follow-up of the chronic conditions as listed above.  She continues on a vitamin D supplement (5000 IUs of vitamin D3 daily), last serum level was checked in March 2021 and showed that her vitamin D serum level was 70.  Last A1c was collected in March 2021 as well and it was 5.9.  Last lipid panel was also collected in March 2021, she believes she was fasting at that time.  Total cholesterol was 244 and LDL is 144.  Both triglycerides and HDL were at good levels.  She is not currently on any medication to control her cholesterol.  Overall today she has no acute concerns and is feeling well.  At her last office visit she was encouraged to eat a plant-based diet, but tells me she has not made many changes to her diet since she was here last.  She does tell me she does not eat much meat or large portions.  She tells me she also does not eat too much processed foods, but does sometimes eat bread and pastas.   Past Medical History:  Diagnosis Date  . Cancer [facility]) 2000   left breast cancer, s/p lumpectomy and XRT  . HLD (hyperlipidemia)   . Shingles   . Vitamin D deficiency disease       Family History  Problem Relation Age of Onset  . Arthritis Mother   . Hypertension Mother   . Kidney disease Mother   . Cancer Mother        breast cancer, kidney cancer  . Arthritis Father   . Heart disease Father   . Fibromyalgia Sister   . Cancer Brother        pancreatic cancer  . Cancer Maternal Aunt        breast cancer    Social History   Social History Narrative   Lives in [place] with husband.Married for 52 years.      Work - homemaker      Diet - regular      Exercise - gardening, very active around  home   Social History   Tobacco Use  . Smoking status: Never Smoker  . Smokeless tobacco: Never Used  Substance Use Topics  . Alcohol use: No     Current Meds  Medication Sig  . Ascorbic Acid (VITAMIN C) 1000 MG tablet Take 1,000 mg by mouth daily.  . Cholecalciferol (VITAMIN D-3) 125 MCG (5000 UT) TABS Take 5,000 Units by mouth daily at 12 noon.  Marland Kitchen glucosamine-chondroitin 500-400 MG tablet Take 1 tablet by mouth 3 (three) times daily.    ROS:  Review of Systems  Constitutional: Negative.   Respiratory: Negative.   Cardiovascular: Negative.   Neurological: Negative.      Objective:   Today's Vitals: BP 100/60 (BP Location: Right Arm, Patient Position: Sitting, Cuff Size: Normal)   Pulse 60   Temp (!) 97.3 F (36.3 C) (Temporal)   Ht 5' (1.524 m)   Wt 136 lb 6.4 oz (61.9 kg)   SpO2 97%   BMI 26.64 kg/m  Vitals with BMI 04/24/2020 01/17/2020 10/19/2019  Height 5\' 0"  5\' 0"  5\' 0"   Weight  136 lbs 6 oz 138 lbs 3 oz 137 lbs 3 oz  BMI 26.64 43.56 86.1  Systolic 683 729 021  Diastolic 60 58 60  Pulse 60 58 72     Physical Exam Vitals reviewed.  Constitutional:      General: She is not in acute distress.    Appearance: Normal appearance.  HENT:     Head: Normocephalic and atraumatic.  Neck:     Vascular: No carotid bruit.  Cardiovascular:     Rate and Rhythm: Normal rate and regular rhythm.     Pulses: Normal pulses.     Heart sounds: Normal heart sounds.  Pulmonary:     Effort: Pulmonary effort is normal.     Breath sounds: Normal breath sounds.  Skin:    General: Skin is warm and dry.  Neurological:     General: No focal deficit present.     Mental Status: She is alert and oriented to person, place, and time.  Psychiatric:        Mood and Affect: Mood normal.        Behavior: Behavior normal.        Judgment: Judgment normal.          Assessment and Plan   1. Vitamin D deficiency disease   2. Mixed hyperlipidemia   3. Prediabetes       Plan: 1.-3.  She will continue on her vitamin D3 supplement dose is currently recommended.  I have highly encouraged her to focus on eating as healthy as possible which includes plenty of plant-based foods, and avoiding processed carbohydrates, as well as monitoring portion sizes of her meat intake.She tells me she understands.  I have given her a handout with these dietary recommendations. Current ASCVD risk score is approximately 4.6%.   Tests ordered No orders of the defined types were placed in this encounter.     No orders of the defined types were placed in this encounter.   Patient to follow-up in 3 to 5 months with Dr. Anastasio Champion at which time repeat blood work may be considered.  Ailene Ards, NP

## 2020-08-06 ENCOUNTER — Ambulatory Visit (INDEPENDENT_AMBULATORY_CARE_PROVIDER_SITE_OTHER): Payer: PPO | Admitting: Internal Medicine

## 2020-08-13 ENCOUNTER — Ambulatory Visit (INDEPENDENT_AMBULATORY_CARE_PROVIDER_SITE_OTHER): Payer: PPO | Admitting: Nurse Practitioner

## 2021-03-12 ENCOUNTER — Telehealth (INDEPENDENT_AMBULATORY_CARE_PROVIDER_SITE_OTHER): Payer: Self-pay | Admitting: Nurse Practitioner

## 2021-03-12 NOTE — Telephone Encounter (Signed)
Left message for patient to call back and schedule Medicare Annual Wellness Visit (AWV) either virtually or in office.   awvi 08/03/17 per [facility]    please schedule at anytime with [facility] health coach  This should be a 45 minute visit.

## 2021-06-26 ENCOUNTER — Encounter: Payer: Self-pay | Admitting: Nurse Practitioner

## 2021-06-26 ENCOUNTER — Ambulatory Visit (INDEPENDENT_AMBULATORY_CARE_PROVIDER_SITE_OTHER): Payer: PPO | Admitting: Nurse Practitioner

## 2021-06-26 ENCOUNTER — Other Ambulatory Visit: Payer: Self-pay

## 2021-06-26 VITALS — BP 120/62 | HR 66 | Ht 60.0 in | Wt 130.2 lb

## 2021-06-26 DIAGNOSIS — E782 Mixed hyperlipidemia: Secondary | ICD-10-CM | POA: Diagnosis not present

## 2021-06-26 DIAGNOSIS — Z853 Personal history of malignant neoplasm of breast: Secondary | ICD-10-CM

## 2021-06-26 DIAGNOSIS — Z1211 Encounter for screening for malignant neoplasm of colon: Secondary | ICD-10-CM

## 2021-06-26 DIAGNOSIS — R630 Anorexia: Secondary | ICD-10-CM | POA: Diagnosis not present

## 2021-06-26 DIAGNOSIS — Z1329 Encounter for screening for other suspected endocrine disorder: Secondary | ICD-10-CM | POA: Diagnosis not present

## 2021-06-26 DIAGNOSIS — R7303 Prediabetes: Secondary | ICD-10-CM | POA: Diagnosis not present

## 2021-06-26 DIAGNOSIS — E559 Vitamin D deficiency, unspecified: Secondary | ICD-10-CM | POA: Diagnosis not present

## 2021-06-26 NOTE — Progress Notes (Signed)
Subjective:    Patient ID: Ashley Lloyd, female    DOB: [birth year], 70 y.o.   MRN: [id number]  HPI: Ashley Lloyd is a 70 y.o. female presenting for new patient visit to establish care.  Introduced to Designer, jewellery role and practice setting.  All questions answered.  Discussed provider/patient relationship and expectations.  Chief Complaint  Patient presents with   Establish Care   Patient denies any medical concerns today.  She does not take any medication besides vitamin D3, vitamin C, and glucosamine.  She occasionally also takes other vitamins.  She does report her appetite has decreased since having COVID about 1 year ago.  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  She does eat foods high in protein including eggs, cheese, and dairy products.  She has not lost sense of taste or smell, however just does not have an appetite all of the time and does not eat as much.  After review of her medical record, does look like she has a history of high cholesterol but has not taken any medication for this in the past.  She also reports history of breast cancer and is due for mammogram, but is reluctant to schedule this.  Allergies  Allergen Reactions   Celecoxib Swelling and Rash    CELEBREX REACTION: Rash , swelling    Outpatient Encounter Medications as of 06/26/2021  Medication Sig   Ascorbic Acid (VITAMIN C) 1000 MG tablet Take 1,000 mg by mouth daily.   Cholecalciferol (VITAMIN D-3) 125 MCG (5000 UT) TABS Take 5,000 Units by mouth daily at 12 noon.   glucosamine-chondroitin 500-400 MG tablet Take 1 tablet by mouth 3 (three) times daily.   No facility-administered encounter medications on file as of 06/26/2021.    Active Ambulatory Problems    Diagnosis Date Noted   ROTATOR CUFF REPAIR, LEFT, HX OF 09/11/2008   Vitamin D deficiency disease    HLD (hyperlipidemia)    Prediabetes 04/24/2020   History of breast  cancer 06/26/2021   Decreased appetite 06/28/2021   Resolved Ambulatory Problems    Diagnosis Date Noted   HYPERLIPIDEMIA 09/18/2008   ALLERGIC RHINITIS 09/11/2008   LEG CRAMPS 09/11/2008   FASTING HYPERGLYCEMIA 09/18/2008   Shingles 02/22/2015   Past Medical History:  Diagnosis Date   Cancer ([place]) 2000    Past Medical History:  Diagnosis Date   Cancer ([place]) 2000   left breast cancer, s/p lumpectomy and XRT   HLD (hyperlipidemia)    Shingles    Vitamin D deficiency disease     Past Surgical History:  Procedure Laterality Date   BREAST LUMPECTOMY     BREAST SURGERY     SHOULDER SURGERY Left 2001   rotator cuff   VAGINAL DELIVERY     4x    Social History   Tobacco Use   Smoking status: Never   Smokeless tobacco: Never  Substance Use Topics   Alcohol use: No   Drug use: No    Family History  Problem Relation Age of Onset   Arthritis Mother    Hypertension Mother    Kidney disease Mother    Cancer Mother        breast cancer, kidney cancer   Arthritis Father    Heart disease Father    Fibromyalgia Sister    Cancer Brother        pancreatic cancer   Cancer Maternal Aunt  breast cancer    Review of Systems  Constitutional:  Positive for appetite change.  HENT: Negative.    Eyes: Negative.   Respiratory: Negative.    Cardiovascular: Negative.   Gastrointestinal: Negative.   Genitourinary: Negative.   Musculoskeletal: Negative.   Skin: Negative.   Neurological: Negative.   Psychiatric/Behavioral: Negative.    Per HPI unless specifically indicated above     Objective:    BP 120/62   Pulse 66   Ht 5' (1.524 m)   Wt 130 lb 3.2 oz (59.1 kg)   SpO2 97%   BMI 25.43 kg/m   Wt Readings from Last 3 Encounters:  06/26/21 130 lb 3.2 oz (59.1 kg)  04/24/20 136 lb 6.4 oz (61.9 kg)  01/17/20 138 lb 3.2 oz (62.7 kg)    Physical Exam Vitals and nursing note reviewed.  Constitutional:      General: She is not in acute distress.    Appearance:  Normal appearance. She is not toxic-appearing.  HENT:     Head: Normocephalic and atraumatic.     Right Ear: External ear normal.     Left Ear: External ear normal.  Eyes:     General: No scleral icterus.       Right eye: No discharge.        Left eye: No discharge.  Neck:     Vascular: No carotid bruit.  Cardiovascular:     Rate and Rhythm: Normal rate and regular rhythm.     Heart sounds: Normal heart sounds. No murmur heard. Pulmonary:     Effort: Pulmonary effort is normal. No respiratory distress.     Breath sounds: Normal breath sounds. No wheezing, rhonchi or rales.  Abdominal:     General: Abdomen is flat. Bowel sounds are normal.     Palpations: Abdomen is soft.     Tenderness: There is no abdominal tenderness.  Musculoskeletal:        General: Normal range of motion.     Cervical back: Normal range of motion.     Right lower leg: No edema.     Left lower leg: No edema.  Skin:    General: Skin is warm and dry.     Capillary Refill: Capillary refill takes less than 2 seconds.     Coloration: Skin is not jaundiced or pale.     Findings: No bruising.  Neurological:     General: No focal deficit present.     Mental Status: She is alert and oriented to person, place, and time.     Motor: No weakness.     Gait: Gait normal.  Psychiatric:        Mood and Affect: Mood normal.        Behavior: Behavior normal.        Thought Content: Thought content normal.        Judgment: Judgment normal.      Assessment & Plan:   Problem List Items Addressed This Visit       Other   Vitamin D deficiency disease    Chronic.  We will check vitamin D and calcium levels today.  Bone scan at the end of 2020 showed osteopenia-will be due for repeat bone scan later this year.      Relevant Orders   VITAMIN D 25 Hydroxy (Vit-D Deficiency, Fractures) (Completed)   Prediabetes    We will check hemoglobin A1c today.  Encouraged low simple sugar foods.      Relevant Orders  Hemoglobin A1c (Completed)   HLD (hyperlipidemia) - Primary    Chronic.  We will check lipids today-patient is fasting.  If ASCVD risk score greater than 7.5%, will recommend starting a statin.  Continue diet low in saturated fat.      Relevant Orders   CBC with Differential/Platelet (Completed)   COMPLETE METABOLIC PANEL WITH GFR (Completed)   Lipid panel (Completed)   History of breast cancer    Patient is aware she is overdue for mammogram and is very reluctant to schedule.  She reports she will think about it and let me know.      Decreased appetite    Chronic x1 year.  I suspect there may be some underlying stress from being the primary caregiver for her elderly mother that is affecting her appetite.  It looks like she has lost about 6 pounds in the past year which is about 5% of her body weight.  We discussed eating meals higher in protein more frequently to prevent further weight loss- we will check baseline labs today since they have not been checked in about a year.  Follow-up pending blood work.      Other Visit Diagnoses     Screening for thyroid disorder       Relevant Orders   TSH (Completed)        Patient declines vaccination including Tdap, shingles, and COVID shot.  She will get flu shot when it becomes available.  She also declines mammogram, colon cancer screening.  I did encourage her to do all of these things.  Follow up plan: Return for pending lab work.

## 2021-06-27 LAB — CBC WITH DIFFERENTIAL/PLATELET
Absolute Monocytes: 699 cells/uL (ref 200–950)
Basophils Absolute: 74 cells/uL (ref 0–200)
Basophils Relative: 0.8 %
Eosinophils Absolute: 331 cells/uL (ref 15–500)
Eosinophils Relative: 3.6 %
HCT: 38 % (ref 35.0–45.0)
Hemoglobin: 12.9 g/dL (ref 11.7–15.5)
Lymphs Abs: 2282 cells/uL (ref 850–3900)
MCH: 31.2 pg (ref 27.0–33.0)
MCHC: 33.9 g/dL (ref 32.0–36.0)
MCV: 91.8 fL (ref 80.0–100.0)
MPV: 9.7 fL (ref 7.5–12.5)
Monocytes Relative: 7.6 %
Neutro Abs: 5814 cells/uL (ref 1500–7800)
Neutrophils Relative %: 63.2 %
Platelets: 347 10*3/uL (ref 140–400)
RBC: 4.14 10*6/uL (ref 3.80–5.10)
RDW: 13 % (ref 11.0–15.0)
Total Lymphocyte: 24.8 %
WBC: 9.2 10*3/uL (ref 3.8–10.8)

## 2021-06-27 LAB — LIPID PANEL
Cholesterol: 226 mg/dL — ABNORMAL HIGH (ref <200)
HDL: 74 mg/dL (ref >=50)
LDL Cholesterol (Calc): 128 mg/dL (calc) — ABNORMAL HIGH
Non-HDL Cholesterol (Calc): 152 mg/dL (calc) — ABNORMAL HIGH (ref <130)
Total CHOL/HDL Ratio: 3.1 (calc) (ref <5.0)
Triglycerides: 128 mg/dL (ref <150)

## 2021-06-27 LAB — HEMOGLOBIN A1C
Hgb A1c MFr Bld: 5.8 % of total Hgb — ABNORMAL HIGH (ref <5.7)
Mean Plasma Glucose: 120 mg/dL
eAG (mmol/L): 6.6 mmol/L

## 2021-06-27 LAB — COMPLETE METABOLIC PANEL WITH GFR
AG Ratio: 1.5 (calc) (ref 1.0–2.5)
ALT: 10 U/L (ref 6–29)
AST: 16 U/L (ref 10–35)
Albumin: 4.2 g/dL (ref 3.6–5.1)
Alkaline phosphatase (APISO): 60 U/L (ref 37–153)
BUN: 20 mg/dL (ref 7–25)
CO2: 23 mmol/L (ref 20–32)
Calcium: 9.3 mg/dL (ref 8.6–10.4)
Chloride: 102 mmol/L (ref 98–110)
Creat: 0.8 mg/dL (ref 0.50–1.05)
Globulin: 2.8 g/dL (calc) (ref 1.9–3.7)
Glucose, Bld: 87 mg/dL (ref 65–99)
Potassium: 4.4 mmol/L (ref 3.5–5.3)
Sodium: 136 mmol/L (ref 135–146)
Total Bilirubin: 0.4 mg/dL (ref 0.2–1.2)
Total Protein: 7 g/dL (ref 6.1–8.1)
eGFR: 80 mL/min/{1.73_m2} (ref >=60)

## 2021-06-27 LAB — TSH: TSH: 2.84 mIU/L (ref 0.40–4.50)

## 2021-06-27 LAB — VITAMIN D 25 HYDROXY (VIT D DEFICIENCY, FRACTURES): Vit D, 25-Hydroxy: 97 ng/mL (ref 30–100)

## 2021-06-28 ENCOUNTER — Other Ambulatory Visit: Payer: Self-pay | Admitting: *Deleted

## 2021-06-28 DIAGNOSIS — H2513 Age-related nuclear cataract, bilateral: Secondary | ICD-10-CM | POA: Diagnosis not present

## 2021-06-28 DIAGNOSIS — H43811 Vitreous degeneration, right eye: Secondary | ICD-10-CM | POA: Diagnosis not present

## 2021-06-28 DIAGNOSIS — R630 Anorexia: Secondary | ICD-10-CM | POA: Insufficient documentation

## 2021-06-28 MED ORDER — ATORVASTATIN CALCIUM 10 MG PO TABS: 10.0000 mg | ORAL_TABLET | Freq: Every day | ORAL | 3 refills | Status: DC

## 2021-06-28 NOTE — Assessment & Plan Note (Signed)
Chronic.  We will check vitamin D and calcium levels today.  Bone scan at the end of 2020 showed osteopenia-will be due for repeat bone scan later this year.

## 2021-06-28 NOTE — Assessment & Plan Note (Signed)
Patient is aware she is overdue for mammogram and is very reluctant to schedule.  She reports she will think about it and let me know.

## 2021-06-28 NOTE — Assessment & Plan Note (Signed)
Chronic.  We will check lipids today-patient is fasting.  If ASCVD risk score greater than 7.5%, will recommend starting a statin.  Continue diet low in saturated fat.

## 2021-06-28 NOTE — Assessment & Plan Note (Signed)
Chronic x1 year.  I suspect there may be some underlying stress from being the primary caregiver for her elderly mother that is affecting her appetite.  It looks like she has lost about 6 pounds in the past year which is about 5% of her body weight.  We discussed eating meals higher in protein more frequently to prevent further weight loss- we will check baseline labs today since they have not been checked in about a year.  Follow-up pending blood work.

## 2021-06-28 NOTE — Assessment & Plan Note (Signed)
We will check hemoglobin A1c today.  Encouraged low simple sugar foods.

## 2021-09-13 DIAGNOSIS — H2513 Age-related nuclear cataract, bilateral: Secondary | ICD-10-CM | POA: Diagnosis not present

## 2021-09-13 DIAGNOSIS — H43813 Vitreous degeneration, bilateral: Secondary | ICD-10-CM | POA: Diagnosis not present

## 2021-10-09 DIAGNOSIS — H43813 Vitreous degeneration, bilateral: Secondary | ICD-10-CM | POA: Diagnosis not present

## 2021-10-09 DIAGNOSIS — H2513 Age-related nuclear cataract, bilateral: Secondary | ICD-10-CM | POA: Diagnosis not present

## 2021-11-05 ENCOUNTER — Telehealth: Payer: Self-pay | Admitting: Nurse Practitioner

## 2021-11-05 MED ORDER — ATORVASTATIN CALCIUM 10 MG PO TABS
10.0000 mg | ORAL_TABLET | Freq: Every day | ORAL | 3 refills | Status: AC
  Filled 2024-07-26: qty 90, 90d supply, fill #0

## 2021-11-05 NOTE — Telephone Encounter (Signed)
Rf sent to new pharm

## 2021-11-05 NOTE — Telephone Encounter (Signed)
Patient's spouse Abe People called to request refill of  atorvastatin (LIPITOR) 10 MG tablet [[id number]]   Requesting to have all scripts sent to [facility] on [address] in [place].   Last dose taken 11/03/21.  Please advise at [phone].

## 2021-12-25 DIAGNOSIS — H2513 Age-related nuclear cataract, bilateral: Secondary | ICD-10-CM | POA: Diagnosis not present

## 2021-12-25 DIAGNOSIS — H43813 Vitreous degeneration, bilateral: Secondary | ICD-10-CM | POA: Diagnosis not present

## 2021-12-30 DIAGNOSIS — Z01818 Encounter for other preprocedural examination: Secondary | ICD-10-CM | POA: Diagnosis not present

## 2021-12-30 DIAGNOSIS — H2511 Age-related nuclear cataract, right eye: Secondary | ICD-10-CM | POA: Diagnosis not present

## 2021-12-30 DIAGNOSIS — H2512 Age-related nuclear cataract, left eye: Secondary | ICD-10-CM | POA: Diagnosis not present

## 2022-01-06 ENCOUNTER — Ambulatory Visit: Payer: PPO | Admitting: Nurse Practitioner

## 2022-01-08 DIAGNOSIS — H2511 Age-related nuclear cataract, right eye: Secondary | ICD-10-CM | POA: Diagnosis not present

## 2022-01-09 DIAGNOSIS — C50912 Malignant neoplasm of unspecified site of left female breast: Secondary | ICD-10-CM | POA: Diagnosis not present

## 2022-01-09 DIAGNOSIS — M549 Dorsalgia, unspecified: Secondary | ICD-10-CM | POA: Diagnosis not present

## 2022-01-09 DIAGNOSIS — E785 Hyperlipidemia, unspecified: Secondary | ICD-10-CM | POA: Diagnosis not present

## 2022-01-22 DIAGNOSIS — H2512 Age-related nuclear cataract, left eye: Secondary | ICD-10-CM | POA: Diagnosis not present

## 2022-01-28 DIAGNOSIS — M549 Dorsalgia, unspecified: Secondary | ICD-10-CM | POA: Diagnosis not present

## 2022-01-28 DIAGNOSIS — E785 Hyperlipidemia, unspecified: Secondary | ICD-10-CM | POA: Diagnosis not present

## 2022-01-28 DIAGNOSIS — Z853 Personal history of malignant neoplasm of breast: Secondary | ICD-10-CM | POA: Diagnosis not present

## 2022-01-28 DIAGNOSIS — E559 Vitamin D deficiency, unspecified: Secondary | ICD-10-CM | POA: Diagnosis not present

## 2022-02-25 DIAGNOSIS — E785 Hyperlipidemia, unspecified: Secondary | ICD-10-CM | POA: Diagnosis not present

## 2022-02-25 DIAGNOSIS — C50912 Malignant neoplasm of unspecified site of left female breast: Secondary | ICD-10-CM | POA: Diagnosis not present

## 2022-02-25 DIAGNOSIS — E559 Vitamin D deficiency, unspecified: Secondary | ICD-10-CM | POA: Diagnosis not present

## 2022-02-25 DIAGNOSIS — R030 Elevated blood-pressure reading, without diagnosis of hypertension: Secondary | ICD-10-CM | POA: Diagnosis not present

## 2022-02-25 DIAGNOSIS — R109 Unspecified abdominal pain: Secondary | ICD-10-CM | POA: Diagnosis not present

## 2022-02-25 DIAGNOSIS — M549 Dorsalgia, unspecified: Secondary | ICD-10-CM | POA: Diagnosis not present

## 2022-02-28 DIAGNOSIS — R109 Unspecified abdominal pain: Secondary | ICD-10-CM | POA: Diagnosis not present

## 2022-02-28 DIAGNOSIS — E785 Hyperlipidemia, unspecified: Secondary | ICD-10-CM | POA: Diagnosis not present

## 2022-02-28 DIAGNOSIS — E559 Vitamin D deficiency, unspecified: Secondary | ICD-10-CM | POA: Diagnosis not present

## 2022-03-26 DIAGNOSIS — E785 Hyperlipidemia, unspecified: Secondary | ICD-10-CM | POA: Diagnosis not present

## 2022-03-26 DIAGNOSIS — J301 Allergic rhinitis due to pollen: Secondary | ICD-10-CM | POA: Diagnosis not present

## 2022-03-26 DIAGNOSIS — R109 Unspecified abdominal pain: Secondary | ICD-10-CM | POA: Diagnosis not present

## 2022-03-26 DIAGNOSIS — I10 Essential (primary) hypertension: Secondary | ICD-10-CM | POA: Diagnosis not present

## 2022-03-26 DIAGNOSIS — E559 Vitamin D deficiency, unspecified: Secondary | ICD-10-CM | POA: Diagnosis not present

## 2022-05-20 DIAGNOSIS — R7303 Prediabetes: Secondary | ICD-10-CM | POA: Diagnosis not present

## 2022-05-20 DIAGNOSIS — R630 Anorexia: Secondary | ICD-10-CM | POA: Diagnosis not present

## 2022-05-20 DIAGNOSIS — T733XXA Exhaustion due to excessive exertion, initial encounter: Secondary | ICD-10-CM | POA: Diagnosis not present

## 2022-05-20 DIAGNOSIS — E785 Hyperlipidemia, unspecified: Secondary | ICD-10-CM | POA: Diagnosis not present

## 2022-05-20 DIAGNOSIS — I10 Essential (primary) hypertension: Secondary | ICD-10-CM | POA: Diagnosis not present

## 2022-05-20 DIAGNOSIS — R252 Cramp and spasm: Secondary | ICD-10-CM | POA: Diagnosis not present

## 2022-05-23 DIAGNOSIS — I10 Essential (primary) hypertension: Secondary | ICD-10-CM | POA: Diagnosis not present

## 2022-05-23 DIAGNOSIS — E785 Hyperlipidemia, unspecified: Secondary | ICD-10-CM | POA: Diagnosis not present

## 2022-05-26 DIAGNOSIS — C50912 Malignant neoplasm of unspecified site of left female breast: Secondary | ICD-10-CM | POA: Diagnosis not present

## 2022-05-26 DIAGNOSIS — E785 Hyperlipidemia, unspecified: Secondary | ICD-10-CM | POA: Diagnosis not present

## 2022-05-26 DIAGNOSIS — I10 Essential (primary) hypertension: Secondary | ICD-10-CM | POA: Diagnosis not present

## 2022-07-03 DIAGNOSIS — I10 Essential (primary) hypertension: Secondary | ICD-10-CM | POA: Diagnosis not present

## 2022-07-03 DIAGNOSIS — E785 Hyperlipidemia, unspecified: Secondary | ICD-10-CM | POA: Diagnosis not present

## 2022-07-03 DIAGNOSIS — C50912 Malignant neoplasm of unspecified site of left female breast: Secondary | ICD-10-CM | POA: Diagnosis not present

## 2022-07-08 DIAGNOSIS — R7303 Prediabetes: Secondary | ICD-10-CM | POA: Diagnosis not present

## 2022-07-08 DIAGNOSIS — R630 Anorexia: Secondary | ICD-10-CM | POA: Diagnosis not present

## 2022-07-31 DIAGNOSIS — I10 Essential (primary) hypertension: Secondary | ICD-10-CM | POA: Diagnosis not present

## 2022-07-31 DIAGNOSIS — E785 Hyperlipidemia, unspecified: Secondary | ICD-10-CM | POA: Diagnosis not present

## 2022-07-31 DIAGNOSIS — C50912 Malignant neoplasm of unspecified site of left female breast: Secondary | ICD-10-CM | POA: Diagnosis not present

## 2022-08-29 DIAGNOSIS — E785 Hyperlipidemia, unspecified: Secondary | ICD-10-CM | POA: Diagnosis not present

## 2022-08-29 DIAGNOSIS — I10 Essential (primary) hypertension: Secondary | ICD-10-CM | POA: Diagnosis not present

## 2022-08-29 DIAGNOSIS — C50912 Malignant neoplasm of unspecified site of left female breast: Secondary | ICD-10-CM | POA: Diagnosis not present

## 2022-09-09 DIAGNOSIS — Z853 Personal history of malignant neoplasm of breast: Secondary | ICD-10-CM | POA: Diagnosis not present

## 2022-09-09 DIAGNOSIS — R7303 Prediabetes: Secondary | ICD-10-CM | POA: Diagnosis not present

## 2022-09-09 DIAGNOSIS — I10 Essential (primary) hypertension: Secondary | ICD-10-CM | POA: Diagnosis not present

## 2022-09-09 DIAGNOSIS — E785 Hyperlipidemia, unspecified: Secondary | ICD-10-CM | POA: Diagnosis not present

## 2022-09-26 DIAGNOSIS — I10 Essential (primary) hypertension: Secondary | ICD-10-CM | POA: Diagnosis not present

## 2022-09-26 DIAGNOSIS — E785 Hyperlipidemia, unspecified: Secondary | ICD-10-CM | POA: Diagnosis not present

## 2022-11-20 ENCOUNTER — Other Ambulatory Visit: Payer: Self-pay | Admitting: Nurse Practitioner

## 2022-11-25 DIAGNOSIS — E785 Hyperlipidemia, unspecified: Secondary | ICD-10-CM | POA: Diagnosis not present

## 2022-11-25 DIAGNOSIS — I10 Essential (primary) hypertension: Secondary | ICD-10-CM | POA: Diagnosis not present

## 2022-12-09 DIAGNOSIS — Z853 Personal history of malignant neoplasm of breast: Secondary | ICD-10-CM | POA: Diagnosis not present

## 2022-12-09 DIAGNOSIS — E785 Hyperlipidemia, unspecified: Secondary | ICD-10-CM | POA: Diagnosis not present

## 2022-12-09 DIAGNOSIS — R7303 Prediabetes: Secondary | ICD-10-CM | POA: Diagnosis not present

## 2022-12-09 DIAGNOSIS — I10 Essential (primary) hypertension: Secondary | ICD-10-CM | POA: Diagnosis not present

## 2022-12-12 DIAGNOSIS — R7303 Prediabetes: Secondary | ICD-10-CM | POA: Diagnosis not present

## 2022-12-12 DIAGNOSIS — E785 Hyperlipidemia, unspecified: Secondary | ICD-10-CM | POA: Diagnosis not present

## 2022-12-12 DIAGNOSIS — I10 Essential (primary) hypertension: Secondary | ICD-10-CM | POA: Diagnosis not present

## 2023-01-02 DIAGNOSIS — I10 Essential (primary) hypertension: Secondary | ICD-10-CM | POA: Diagnosis not present

## 2023-01-02 DIAGNOSIS — E785 Hyperlipidemia, unspecified: Secondary | ICD-10-CM | POA: Diagnosis not present

## 2023-01-02 DIAGNOSIS — Z853 Personal history of malignant neoplasm of breast: Secondary | ICD-10-CM | POA: Diagnosis not present

## 2023-03-04 DIAGNOSIS — R400 Somnolence: Secondary | ICD-10-CM | POA: Diagnosis not present

## 2023-03-04 DIAGNOSIS — E785 Hyperlipidemia, unspecified: Secondary | ICD-10-CM | POA: Diagnosis not present

## 2023-03-04 DIAGNOSIS — G479 Sleep disorder, unspecified: Secondary | ICD-10-CM | POA: Diagnosis not present

## 2023-03-04 DIAGNOSIS — I10 Essential (primary) hypertension: Secondary | ICD-10-CM | POA: Diagnosis not present

## 2023-03-24 DIAGNOSIS — E785 Hyperlipidemia, unspecified: Secondary | ICD-10-CM | POA: Diagnosis not present

## 2023-03-24 DIAGNOSIS — I10 Essential (primary) hypertension: Secondary | ICD-10-CM | POA: Diagnosis not present

## 2023-06-09 DIAGNOSIS — E559 Vitamin D deficiency, unspecified: Secondary | ICD-10-CM | POA: Diagnosis not present

## 2023-06-09 DIAGNOSIS — R7303 Prediabetes: Secondary | ICD-10-CM | POA: Diagnosis not present

## 2023-06-09 DIAGNOSIS — E785 Hyperlipidemia, unspecified: Secondary | ICD-10-CM | POA: Diagnosis not present

## 2023-06-09 DIAGNOSIS — R400 Somnolence: Secondary | ICD-10-CM | POA: Diagnosis not present

## 2023-06-09 DIAGNOSIS — I10 Essential (primary) hypertension: Secondary | ICD-10-CM | POA: Diagnosis not present

## 2023-06-10 DIAGNOSIS — E785 Hyperlipidemia, unspecified: Secondary | ICD-10-CM | POA: Diagnosis not present

## 2023-06-10 DIAGNOSIS — R7303 Prediabetes: Secondary | ICD-10-CM | POA: Diagnosis not present

## 2023-06-10 DIAGNOSIS — I10 Essential (primary) hypertension: Secondary | ICD-10-CM | POA: Diagnosis not present

## 2023-06-10 DIAGNOSIS — R400 Somnolence: Secondary | ICD-10-CM | POA: Diagnosis not present

## 2023-06-10 DIAGNOSIS — E559 Vitamin D deficiency, unspecified: Secondary | ICD-10-CM | POA: Diagnosis not present

## 2023-07-04 DIAGNOSIS — E785 Hyperlipidemia, unspecified: Secondary | ICD-10-CM | POA: Diagnosis not present

## 2023-07-04 DIAGNOSIS — I10 Essential (primary) hypertension: Secondary | ICD-10-CM | POA: Diagnosis not present

## 2023-07-29 ENCOUNTER — Encounter: Payer: Self-pay | Admitting: Pharmacist

## 2023-07-29 NOTE — Progress Notes (Signed)
Pharmacy Quality Measure Review  This patient is appearing on a report for being at risk of failing the adherence measure for hypertension (ACEi/ARB) medications this calendar year.   Medication: lisinopril 20 mg  Last fill date: 05/11/23 for 90 day supply  Insurance report was not up to date. No action needed at this time.   Jarrett Ables, PharmD PGY-1 Pharmacy Resident

## 2023-08-09 ENCOUNTER — Encounter: Payer: Self-pay | Admitting: Pharmacist

## 2023-08-09 NOTE — Progress Notes (Signed)
Pharmacy Quality Measure Review  This patient is appearing on a report for being at risk of failing the adherence measure for cholesterol (statin) medications this calendar year.   Medication: atorvastatin 10 mg Last fill date: 8/19 for 90 day supply  Insurance report was not up to date. No action needed at this time.  Jarrett Ables, PharmD PGY-1 Pharmacy Resident

## 2023-08-26 DIAGNOSIS — I10 Essential (primary) hypertension: Secondary | ICD-10-CM | POA: Diagnosis not present

## 2023-08-26 DIAGNOSIS — C50912 Malignant neoplasm of unspecified site of left female breast: Secondary | ICD-10-CM | POA: Diagnosis not present

## 2023-08-26 DIAGNOSIS — E785 Hyperlipidemia, unspecified: Secondary | ICD-10-CM | POA: Diagnosis not present

## 2023-09-09 DIAGNOSIS — I10 Essential (primary) hypertension: Secondary | ICD-10-CM | POA: Diagnosis not present

## 2023-09-09 DIAGNOSIS — Z0001 Encounter for general adult medical examination with abnormal findings: Secondary | ICD-10-CM | POA: Diagnosis not present

## 2023-09-09 DIAGNOSIS — R400 Somnolence: Secondary | ICD-10-CM | POA: Diagnosis not present

## 2023-09-09 DIAGNOSIS — E785 Hyperlipidemia, unspecified: Secondary | ICD-10-CM | POA: Diagnosis not present

## 2023-09-09 DIAGNOSIS — R7303 Prediabetes: Secondary | ICD-10-CM | POA: Diagnosis not present

## 2023-09-09 DIAGNOSIS — E559 Vitamin D deficiency, unspecified: Secondary | ICD-10-CM | POA: Diagnosis not present

## 2023-09-17 DIAGNOSIS — R7303 Prediabetes: Secondary | ICD-10-CM | POA: Diagnosis not present

## 2023-09-17 DIAGNOSIS — I10 Essential (primary) hypertension: Secondary | ICD-10-CM | POA: Diagnosis not present

## 2023-09-17 DIAGNOSIS — E785 Hyperlipidemia, unspecified: Secondary | ICD-10-CM | POA: Diagnosis not present

## 2023-09-17 DIAGNOSIS — R400 Somnolence: Secondary | ICD-10-CM | POA: Diagnosis not present

## 2023-09-17 DIAGNOSIS — E559 Vitamin D deficiency, unspecified: Secondary | ICD-10-CM | POA: Diagnosis not present

## 2023-09-18 DIAGNOSIS — I10 Essential (primary) hypertension: Secondary | ICD-10-CM | POA: Diagnosis not present

## 2023-09-18 DIAGNOSIS — E785 Hyperlipidemia, unspecified: Secondary | ICD-10-CM | POA: Diagnosis not present

## 2023-09-18 DIAGNOSIS — C50912 Malignant neoplasm of unspecified site of left female breast: Secondary | ICD-10-CM | POA: Diagnosis not present

## 2023-09-30 DIAGNOSIS — Z1212 Encounter for screening for malignant neoplasm of rectum: Secondary | ICD-10-CM | POA: Diagnosis not present

## 2024-07-11 ENCOUNTER — Emergency Department (HOSPITAL_BASED_OUTPATIENT_CLINIC_OR_DEPARTMENT_OTHER)
Admission: EM | Admit: 2024-07-11 | Discharge: 2024-07-11 | Disposition: A | Discharge status: Home / Self Care | Attending: Emergency Medicine | Admitting: Emergency Medicine

## 2024-07-11 ENCOUNTER — Emergency Department (HOSPITAL_BASED_OUTPATIENT_CLINIC_OR_DEPARTMENT_OTHER)

## 2024-07-11 ENCOUNTER — Other Ambulatory Visit: Payer: Self-pay

## 2024-07-11 DIAGNOSIS — D72829 Elevated white blood cell count, unspecified: Secondary | ICD-10-CM | POA: Diagnosis not present

## 2024-07-11 DIAGNOSIS — R319 Hematuria, unspecified: Secondary | ICD-10-CM | POA: Diagnosis present

## 2024-07-11 DIAGNOSIS — R531 Weakness: Secondary | ICD-10-CM | POA: Insufficient documentation

## 2024-07-11 LAB — URINALYSIS, ROUTINE W REFLEX MICROSCOPIC
Bacteria, UA: NONE SEEN
Bilirubin Urine: NEGATIVE
Glucose, UA: >1000 mg/dL — AB
Ketones, ur: NEGATIVE mg/dL
Leukocytes,Ua: NEGATIVE
Nitrite: NEGATIVE
Protein, ur: NEGATIVE mg/dL
Specific Gravity, Urine: 1.016 (ref 1.005–1.030)
pH: 5 (ref 5.0–8.0)

## 2024-07-11 LAB — COMPREHENSIVE METABOLIC PANEL WITH GFR
ALT: 11 U/L (ref 0–44)
AST: 26 U/L (ref 15–41)
Albumin: 4.3 g/dL (ref 3.5–5.0)
Alkaline Phosphatase: 78 U/L (ref 38–126)
Anion gap: 15 (ref 5–15)
BUN: 19 mg/dL (ref 8–23)
CO2: 22 mmol/L (ref 22–32)
Calcium: 10.2 mg/dL (ref 8.9–10.3)
Chloride: 101 mmol/L (ref 98–111)
Creatinine, Ser: 1.04 mg/dL — ABNORMAL HIGH (ref 0.44–1.00)
GFR, Estimated: 57 mL/min — ABNORMAL LOW (ref >60)
Glucose, Bld: 97 mg/dL (ref 70–99)
Potassium: 4.8 mmol/L (ref 3.5–5.1)
Sodium: 137 mmol/L (ref 135–145)
Total Bilirubin: 0.4 mg/dL (ref 0.0–1.2)
Total Protein: 7.8 g/dL (ref 6.5–8.1)

## 2024-07-11 LAB — CBC
HCT: 39.7 % (ref 36.0–46.0)
Hemoglobin: 13.2 g/dL (ref 12.0–15.0)
MCH: 30.7 pg (ref 26.0–34.0)
MCHC: 33.2 g/dL (ref 30.0–36.0)
MCV: 92.3 fL (ref 80.0–100.0)
Platelets: 355 K/uL (ref 150–400)
RBC: 4.3 MIL/uL (ref 3.87–5.11)
RDW: 14.1 % (ref 11.5–15.5)
WBC: 10.6 K/uL — ABNORMAL HIGH (ref 4.0–10.5)
nRBC: 0 % (ref 0.0–0.2)

## 2024-07-11 LAB — D-DIMER, QUANTITATIVE: D-Dimer, Quant: 0.59 ug{FEU}/mL — ABNORMAL HIGH (ref 0.00–0.50)

## 2024-07-11 LAB — TROPONIN T, HIGH SENSITIVITY: Troponin T High Sensitivity: <15 ng/L (ref 0–19)

## 2024-07-11 LAB — CBG MONITORING, ED: Glucose-Capillary: 91 mg/dL (ref 70–99)

## 2024-07-11 NOTE — ED Notes (Signed)
 DC paperwork given and verbally understood.

## 2024-07-11 NOTE — Discharge Instructions (Signed)
 I have provided you with a referral to the cardiologist, you should hear from their office in the coming days to schedule a follow-up.  Follow-up with your primary care provider in regard to your hematuria (blood in the urine).  Return to the emergency department if your symptoms worsen.

## 2024-07-11 NOTE — ED Provider Notes (Signed)
 [place] EMERGENCY DEPARTMENT AT [facility] Provider Note   CSN: [id number] Arrival date & time: 07/11/24  1237     Patient presents with: Fatigue   Ashley Lloyd is a 73 y.o. female.   73 year old female presenting with weakness.  Patient and her husband reports over the past month she has been notably weak, she reports having difficulty doing her usual tasks around the house/yard, states feels like I have been up and running when I try to do anything.  Endorses worsening indigestion/heartburn over the past week, feeling full/satiated quickly once she begins eating, which has reduced her appetite.  Denies chest pain, nausea/vomiting, lower extremity edema, history of blood clots, fever, dysuria, cough/congestion.  She is on a statin and recently started Jardiance approximately 1 week ago.  She was previously on lisinopril, however she was told by her primary care provider to discontinue this medication due to low blood pressure, her husband tells me that yesterday they checked her blood pressure at home as she was feeling more weak/fatigued and it was systolic 90s over diastolic 60s.  History of breast cancer ~30 years ago.        Prior to Admission medications   Medication Sig Start Date End Date Taking? Authorizing Provider  Ascorbic Acid (VITAMIN C) 1000 MG tablet Take 1,000 mg by mouth daily.    [provider]  atorvastatin  (LIPITOR) 10 MG tablet Take 1 tablet (10 mg total) by mouth daily. 11/05/21   Chandra Harlene DELENA, NP  Cholecalciferol (VITAMIN D -3) 125 MCG (5000 UT) TABS Take 5,000 Units by mouth daily at 12 noon.    [provider]  glucosamine-chondroitin 500-400 MG tablet Take 1 tablet by mouth 3 (three) times daily.    [provider]    Allergies: Celecoxib    Review of Systems  Updated Vital Signs  Vitals:   07/11/24 1530 07/11/24 1630 07/11/24 1730 07/11/24 1800  BP:  (!) 170/54 (!) 137/52 (!) 141/55  Pulse: 60 (!) 54 (!)  54 (!) 54  Resp: 18 18 18 18   Temp:      TempSrc:      SpO2: 98% 100% 99% 100%     Physical Exam Vitals and nursing note reviewed.  HENT:     Head: Normocephalic.  Eyes:     Extraocular Movements: Extraocular movements intact.  Cardiovascular:     Rate and Rhythm: Normal rate and regular rhythm.     Heart sounds: Normal heart sounds.  Pulmonary:     Effort: Pulmonary effort is normal.     Breath sounds: Normal breath sounds.  Abdominal:     Palpations: Abdomen is soft.     Tenderness: There is no abdominal tenderness. There is no guarding.  Musculoskeletal:     Cervical back: Normal range of motion.     Right lower leg: No edema.     Left lower leg: No edema.     Comments: Moves all extremities spontaneously without difficulty 5/5 strength against resistance of bilateral upper and lower extremities  Skin:    General: Skin is warm and dry.  Neurological:     General: No focal deficit present.     Mental Status: She is alert and oriented to person, place, and time.     Sensory: No sensory deficit.     Motor: No weakness.     Comments: Facial expressions are symmetric and intact without evidence of facial droop No appreciable sensory deficit/weakness     (all labs ordered  are listed, but only abnormal results are displayed) Labs Reviewed  CBC - Abnormal; Notable for the following components:      Result Value   WBC 10.6 (*)    All other components within normal limits  COMPREHENSIVE METABOLIC PANEL WITH GFR - Abnormal; Notable for the following components:   Creatinine, Ser 1.04 (*)    GFR, Estimated 57 (*)    All other components within normal limits  D-DIMER, QUANTITATIVE - Abnormal; Notable for the following components:   D-Dimer, Quant 0.59 (*)    All other components within normal limits  URINALYSIS, ROUTINE W REFLEX MICROSCOPIC - Abnormal; Notable for the following components:   Color, Urine COLORLESS (*)    Glucose, UA >1,000 (*)    Hgb urine dipstick  MODERATE (*)    All other components within normal limits  CBG MONITORING, ED  TROPONIN T, HIGH SENSITIVITY    EKG: EKG Interpretation Date/Time:  Monday July 11 2024 13:14:03 EDT Ventricular Rate:  63 PR Interval:  136 QRS Duration:  76 QT Interval:  374 QTC Calculation: 382 R Axis:   48  Text Interpretation: Normal sinus rhythm Normal ECG When compared with ECG of 05-Feb-2015 18:47, No significant change was found Confirmed by Dreama Longs ([id number]) on 07/11/2024 4:40:56 PM  Radiology: [facility] Chest Portable 1 View Result Date: 07/11/2024 CLINICAL DATA:  Weakness, short of breath, tachycardia EXAM: PORTABLE CHEST 1 VIEW COMPARISON:  02/05/2015 FINDINGS: The heart size and mediastinal contours are within normal limits. Both lungs are clear. The visualized skeletal structures are unremarkable. IMPRESSION: No active disease. Electronically Signed   By: Ozell Daring M.D.   On: 07/11/2024 16:20     Procedures   Medications Ordered in the ED - No data to display                                  Medical Decision Making This patient presents to the ED for concern of weakness, this involves an extensive number of treatment options, and is a complaint that carries with it a high risk of complications and morbidity.  The differential diagnosis includes electrolyte disturbance, dehydration, arrhythmia, pneumonia, urinary tract infection   Co morbidities that complicate the patient evaluation  Stage 3a CKD, hyperlipidemia, hypertension   Additional history obtained:  Additional history obtained from record review External records from outside source obtained and reviewed including recent PCP note   Lab Tests:  I Ordered, and personally interpreted labs.  The pertinent results include:  CBC with borderline leukocytosis of 10.6.  D-dimer elevated at 0.59, however given the patient is 73 years old this is within normal range given age-adjusted D-dimer measurements. CMP mildly  elevated at 1.04, most recent baseline 1.03 upon outside record review from earlier this month. Urinalysis notable for glucosuria with moderate blood, no obvious signs of infection. Initial troponin < 15, repeat troponin discontinued given low suspicion for ACS as she is not/has not experienced chest pain.   Imaging Studies ordered:  I ordered imaging studies including CXR  I independently visualized and interpreted imaging which showed No active disease.  I agree with the radiologist interpretation   Cardiac Monitoring: / EKG:  The patient was maintained on a cardiac monitor.  I personally viewed and interpreted the cardiac monitored which showed an underlying rhythm of: NSR  Problem List / ED Course / Critical interventions / Medication management  I have reviewed the patients home medicines  and have made adjustments as needed   Test / Admission - Considered:  Physical exam is largely unremarkable as above.  Orthostatic vitals obtained by nursing staff, see below. No obvious orthostasis.  Orthostatic Lying BP- Lying: 168/58Pulse- Lying: 62 Orthostatic Sitting BP- Sitting: 164/61Pulse- Sitting: 58 Orthostatic Standing at 0 minutes BP- Standing at 0 minutes: 156/65Pulse- Standing at 0 minutes: 63  Labs are notable as above, D-dimer is mildly elevated however when adjusted for the patient's age it falls within normal range, I have a low suspicion for pulmonary embolism as contributing to her symptoms, she has no lower extremity edema, no personal risk factors for DVT/PE including active malignancy/recent travel/recent immobilization/prior history of DVT, and therefore did not proceed with CTA imaging.  Urinalysis notable for moderate RBCs, patient has not noticed gross blood in her urine nor does not complain of dysuria.  I recommend that she follow-up with her primary care provider in regard to her hematuria, as she may benefit from urology referral if this persists.   In general,  workup is reassuring, EKG is without evidence of arrhythmia, CXR normal, initial troponin within normal limits.  Given patient's continued weakness that has been ongoing for 1 month, I wonder if there is an underlying/intermittent arrhythmia that may be contributing to her exertional symptoms.  I will place an ambulatory referral to cardiology, she tells me she has already been referred to cardiology by her primary care provider given her continued weakness, however her appointment is not till November.  She has not been seen by a cardiologist in the past and she has no history of arrhythmia.  I discussed these findings in depth with the patient and her husband and they are in agreement with this plan.  Strict return precautions discussed.  She is appropriate for discharge at this time.  Amount and/or Complexity of Data Reviewed Labs: ordered. Radiology: ordered.        Final diagnoses:  Weakness  Hematuria, unspecified type    ED Discharge Orders          Ordered    Ambulatory referral to Cardiology       Comments: If you have not heard from the Cardiology office within the next 72 hours please call [phone].   07/11/24 1814               Ashley Rocky SAILOR, PA-C 07/11/24 1842    Dreama Rocky, MD 07/12/24 1250

## 2024-07-11 NOTE — ED Notes (Signed)
 CBG 91. Meter having trouble connecting to wifi.

## 2024-07-11 NOTE — ED Triage Notes (Signed)
 Patient states weakness for several weeks. Also endorses shortness of breath and tachycardia on exertion.

## 2024-07-11 NOTE — ED Notes (Signed)
 PT tolerated Ortho VS well. No complaints at this time EMT-P Josh informed.

## 2024-07-15 ENCOUNTER — Ambulatory Visit: Attending: Cardiology | Admitting: Cardiology

## 2024-07-15 ENCOUNTER — Ambulatory Visit

## 2024-07-15 ENCOUNTER — Encounter: Payer: Self-pay | Admitting: Cardiology

## 2024-07-15 VITALS — BP 147/67 | HR 61 | Ht 60.0 in | Wt 136.0 lb

## 2024-07-15 DIAGNOSIS — R5383 Other fatigue: Secondary | ICD-10-CM

## 2024-07-15 DIAGNOSIS — R001 Bradycardia, unspecified: Secondary | ICD-10-CM

## 2024-07-15 DIAGNOSIS — R630 Anorexia: Secondary | ICD-10-CM

## 2024-07-15 DIAGNOSIS — R0609 Other forms of dyspnea: Secondary | ICD-10-CM

## 2024-07-15 NOTE — Progress Notes (Signed)
 Cardiology Office Note:  .   Date:  07/15/2024  ID:  Ashley Lloyd, DOB 03/06/1951, MRN [id number] PCP: Mattie Curtis DEL, FNP  [facility] Providers Cardiologist:  None     History of Present Illness: .     History of Present Illness Ashley Lloyd is a 73 year old female with symptomatic bradycardia who presents with weakness and difficulty performing usual tasks.  She experiences significant weakness and difficulty performing usual tasks, describing it as feeling 'like she'd been up running around' when trying to do anything. This has been ongoing for a while and has worsened over time. She visited the emergency department on July 11, 2024, due to these symptoms. An EKG in the emergency department was reported to show normal sinus rhythm at 63 bpm. Orthostatic blood pressure measurements were performed in the emergency department.  She has a history of low blood pressure, which led her to stop taking lisinopril. She recently started taking Jardiance three weeks ago due to slightly elevated blood sugar levels. She was noted to be dehydrated during a recent blood test, although her electrolytes were normal in the emergency department.  She experiences worsening indigestion, feeling full and satiated quickly, which has impacted her ability to eat large meals. Her appetite has decreased, stating, 'I just can't eat a lot at one time.'  Her father had a history of heart disease and heart failure, with symptoms similar to hers, including weakness and leg cramps. She experiences leg cramps, particularly in her foot and leg, which have been present for a while.  [place] and finds it difficult to perform tasks such as walking to the chicken coop, which is a couple of hundred feet from her house, due to weakness and shortness of breath.     Studies Reviewed: .        Results LABS Troponin: Normal (07/11/2024) Hemoglobin: 13 g/dL (90/91/7974) Sodium: 862 mmol/L  (07/11/2024) Potassium: 4.8 mmol/L (07/11/2024) Creatinine: 1.04 mg/dL (90/91/7974)  DIAGNOSTIC EKG: Normal sinus rhythm, heart rate 63 bpm (07/11/2024)  D-dimer: 0.59 with 0.5 being upper normal Troponin: Less than 15  ER notes reviewed. Risk Assessment/Calculations:           Physical Exam:   VS:  BP (!) 147/67   Pulse 61   Ht 5' (1.524 m)   Wt 136 lb (61.7 kg)   SpO2 98%   BMI 26.56 kg/m    Wt Readings from Last 3 Encounters:  07/15/24 136 lb (61.7 kg)  06/26/21 130 lb 3.2 oz (59.1 kg)  04/24/20 136 lb 6.4 oz (61.9 kg)    GEN: Well nourished, well developed in no acute distress NECK: No JVD; No carotid bruits CARDIAC: RRR, no murmurs, no rubs, no gallops RESPIRATORY:  Clear to auscultation without rales, wheezing or rhonchi  ABDOMEN: Soft, non-tender, non-distended EXTREMITIES:  No edema; No deformity   ASSESSMENT AND PLAN: .    Assessment and Plan Assessment & Plan Weakness and exertional fatigue Experiencing significant weakness and exertional fatigue, particularly during usual tasks. Symptoms are persistent and worsening, not alleviated by cooler weather. Differential includes cardiac issues due to family history of heart failure and similar symptoms in her father. No myocardial infarction or anemia based on ER evaluation. Normal EKG and troponins. Hemoglobin is 13. Sodium, potassium, and creatinine levels are normal. Liver function tests are normal. Symptoms may be related to cardiac function or intermittent arrhythmias. - Order echocardiogram to assess cardiac function and rule out  heart failure. - Order Zio patch to monitor for intermittent arrhythmias. - Order coronary CT to evaluate for coronary artery disease.  Her symptoms could be her manifestation of angina.  Symptomatic bradycardia (intermittent) Intermittent symptomatic bradycardia suspected as a potential cause of weakness and fatigue. EKG in the ER showed normal sinus rhythm at 63 bpm. No arrhythmias  detected during ER visit, but intermittent nature may require further monitoring. - Order Zio patch to monitor for intermittent bradycardia or other arrhythmias.  Intermittent hypotension Reports of intermittent hypotension with home blood pressure readings as low as 90/60 mmHg. Blood pressure in the ER was 170/54 mmHg, indicating variability. Orthostatic blood pressures in the ER were normal. Symptoms may be exacerbated by low blood pressure, possibly related to recent medication changes or dehydration. - Advise increasing fluid intake and consider adding salt to diet to help manage low blood pressure. - Serum sodium to potassium normal.  Not classic for adrenal insufficiency.  Leg cramps Experiencing persistent leg cramps, particularly in the foot and leg. Family history of similar symptoms in father, who had heart failure. - Consider potential electrolyte imbalances or other underlying causes.  Indigestion and early satiety Experiencing worsening indigestion and early satiety. Recently started on Jardiance for elevated blood sugar, which may contribute to gastrointestinal symptoms. Reports feeling full quickly and decreased appetite. - Consider dietary modifications to manage indigestion and early satiety.         Signed, Oneil Parchment, MD

## 2024-07-15 NOTE — Progress Notes (Unsigned)
 Enrolled patient for a 14 day Zio XT  monitor to be mailed to patients home

## 2024-07-15 NOTE — Patient Instructions (Signed)
 Medication Instructions:  The current medical regimen is effective;  continue present plan and medications.  *If you need a refill on your cardiac medications before your next appointment, please call your pharmacy*   Testing/Procedures: Your physician has requested that you have an echocardiogram. Echocardiography is a painless test that uses sound waves to create images of your heart. It provides your doctor with information about the size and shape of your heart and how well your heart's chambers and valves are working. This procedure takes approximately one hour. There are no restrictions for this procedure. Please do NOT wear cologne, perfume, aftershave, or lotions (deodorant is allowed). Please arrive 15 minutes prior to your appointment time.  Please note: We ask at that you not bring children with you during ultrasound (echo/ vascular) testing. Due to room size and safety concerns, children are not allowed in the ultrasound rooms during exams. Our front office staff cannot provide observation of children in our lobby area while testing is being conducted. An adult accompanying a patient to their appointment will only be allowed in the ultrasound room at the discretion of the ultrasound technician under special circumstances. We apologize for any inconvenience.    Your cardiac CT will be scheduled at:   [facility] [address]  [place], [place] [address] [phone]  Please enter the parking lot using the [address] entrance and use the FREE valet service at the patient drop-off area. Enter the building and check-in with registration on the main floor.  Please follow these instructions carefully (unless otherwise directed):  An IV will be required for this test and Nitroglycerin will be given.   On the Night Before the Test: Be sure to Drink plenty of water. Do not consume any caffeinated/decaffeinated beverages or chocolate 12 hours prior  to your test. Do not take any antihistamines 12 hours prior to your test.  On the Day of the Test: Drink plenty of water until 1 hour prior to the test. Do not eat any food 1 hour prior to test. You may take your regular medications prior to the test.  Take metoprolol (Lopressor) two hours prior to test. If you take Furosemide/Hydrochlorothiazide/Spironolactone/Chlorthalidone, please HOLD on the morning of the test. Patients who wear a continuous glucose monitor MUST remove the device prior to scanning. FEMALES- please wear underwire-free bra if available, avoid dresses & tight clothing  After the Test: Drink plenty of water. After receiving IV contrast, you may experience a mild flushed feeling. This is normal. On occasion, you may experience a mild rash up to 24 hours after the test. This is not dangerous. If this occurs, you can take Benadryl 25 mg, Zyrtec, Claritin, or Allegra and increase your fluid intake. (Patients taking Tikosyn should avoid Benadryl, and may take Zyrtec, Claritin, or Allegra) If you experience trouble breathing, this can be serious. If it is severe call 911 IMMEDIATELY. If it is mild, please call our office.  We will call to schedule your test 2-4 weeks out understanding that some insurance companies will need an authorization prior to the service being performed.   For more information and frequently asked questions, please visit our website : [email or web address]  For non-scheduling related questions, please contact the cardiac imaging nurse navigator should you have any questions/concerns: Cardiac Imaging Nurse Navigators Direct Office Dial: [phone]   For scheduling needs, including cancellations and rescheduling, please call [place], [phone].  ZIO XT- Long Term Monitor Instructions  Your physician has requested you  wear a ZIO patch monitor for 14 days.  This is a single patch monitor. [employer] supplies one patch monitor per  enrollment. Additional stickers are not available. Please do not apply patch if you will be having a Nuclear Stress Test,  Echocardiogram, Cardiac CT, MRI, or Chest Xray during the period you would be wearing the  monitor. The patch cannot be worn during these tests. You cannot remove and re-apply the  ZIO XT patch monitor.  Your ZIO patch monitor will be mailed 3 day USPS to your address on file. It may take 3-5 days  to receive your monitor after you have been enrolled.  Once you have received your monitor, please review the enclosed instructions. Your monitor  has already been registered assigning a specific monitor serial # to you.  Billing and Patient Assistance Program Information  We have supplied [employer] with any of your insurance information on file for billing purposes. [employer] offers a sliding scale Patient Assistance Program for patients that do not have  insurance, or whose insurance does not completely cover the cost of the ZIO monitor.  You must apply for the Patient Assistance Program to qualify for this discounted rate.  To apply, please call [employer] at [phone], select option 4, select option 2, ask to apply for  Patient Assistance Program. [facility] will ask your household income, and how many people  are in your household. They will quote your out-of-pocket cost based on that information.  [employer] will also be able to set up a 42-month, interest-free payment plan if needed.  Applying the monitor   Shave hair from upper left chest.  Hold abrader disc by orange tab. Rub abrader in 40 strokes over the upper left chest as  indicated in your monitor instructions.  Clean area with 4 enclosed alcohol pads. Let dry.  Apply patch as indicated in monitor instructions. Patch will be placed under collarbone on left  side of chest with arrow pointing upward.  Rub patch adhesive wings for 2 minutes. Remove white label marked 1. Remove the white  label marked 2. Rub patch  adhesive wings for 2 additional minutes.  While looking in a mirror, press and release button in center of patch. A small green light will  flash 3-4 times. This will be your only indicator that the monitor has been turned on.  Do not shower for the first 24 hours. You may shower after the first 24 hours.  Press the button if you feel a symptom. You will hear a small click. Record Date, Time and  Symptom in the Patient Logbook.  When you are ready to remove the patch, follow instructions on the last 2 pages of Patient  Logbook. Stick patch monitor onto the last page of Patient Logbook.  Place Patient Logbook in the blue and white box. Use locking tab on box and tape box closed  securely. The blue and white box has prepaid postage on it. Please place it in the mailbox as  soon as possible. Your physician should have your test results approximately 7 days after the  monitor has been mailed back to [facility].  Call [facility] Customer Care at [phone] if you have questions regarding  your ZIO XT patch monitor. Call them immediately if you see an orange light blinking on your  monitor.  If your monitor falls off in less than 4 days, contact our Monitor department at [phone].  If your monitor becomes loose or falls off after 4 days call [employer]  at [phone] for  suggestions on securing your monitor  Follow-Up: At [facility], you and your health needs are our priority.  As part of our continuing mission to provide you with exceptional heart care, our providers are all part of one team.  This team includes your primary Cardiologist (physician) and Advanced Practice Providers or APPs (Physician Assistants and Nurse Practitioners) who all work together to provide you with the care you need, when you need it.  Your next appointment:   Follow up will be based on the results of the above testing.  mend signing up for the patient portal called MyChart.  Sign up  information is provided on this After Visit Summary.  MyChart is used to connect with patients for Virtual Visits (Telemedicine).  Patients are able to view lab/test results, encounter notes, upcoming appointments, etc.  Non-urgent messages can be sent to your provider as well.   To learn more about what you can do with MyChart, go to [email or web address].

## 2024-07-19 ENCOUNTER — Ambulatory Visit (HOSPITAL_COMMUNITY)
Admission: RE | Admit: 2024-07-19 | Discharge: 2024-07-19 | Disposition: A | Discharge status: Home / Self Care | Source: Ambulatory Visit | Attending: Cardiology | Admitting: Cardiology

## 2024-07-19 DIAGNOSIS — I2510 Atherosclerotic heart disease of native coronary artery without angina pectoris: Secondary | ICD-10-CM | POA: Diagnosis not present

## 2024-07-19 DIAGNOSIS — R0609 Other forms of dyspnea: Secondary | ICD-10-CM | POA: Insufficient documentation

## 2024-07-19 MED ORDER — NITROGLYCERIN 0.4 MG SL SUBL
0.8000 mg | SUBLINGUAL_TABLET | Freq: Once | SUBLINGUAL | Status: AC
  Administered 2024-07-19: 0.8 mg via SUBLINGUAL

## 2024-07-19 MED ORDER — IOHEXOL 350 MG/ML SOLN
100.0000 mL | Freq: Once | INTRAVENOUS | Status: AC | PRN
  Administered 2024-07-19: 100 mL via INTRAVENOUS

## 2024-07-19 NOTE — Progress Notes (Signed)
 Small infiltration of saline after contrast injection, estimated 20cc to R AC / upper arm.   Pt reports slight discomfort. She has strong pulses distally and good range of motion of fingers and wrist.   PIV removed, bandage applied to control bleeding. Ice pack applied and informed patient of return precautions - numbness, tingling, pallor, excessive bleeding.   Camie Shutter RN Navigator Cardiac Imaging [facility] [phone] Office  [phone] Cell

## 2024-07-20 ENCOUNTER — Ambulatory Visit: Payer: Self-pay | Admitting: Cardiology

## 2024-07-26 ENCOUNTER — Other Ambulatory Visit (HOSPITAL_BASED_OUTPATIENT_CLINIC_OR_DEPARTMENT_OTHER): Payer: Self-pay

## 2024-07-26 ENCOUNTER — Other Ambulatory Visit: Payer: Self-pay

## 2024-07-26 ENCOUNTER — Other Ambulatory Visit (HOSPITAL_COMMUNITY): Payer: Self-pay

## 2024-07-26 MED ORDER — EMPAGLIFLOZIN 10 MG PO TABS
10.0000 mg | ORAL_TABLET | Freq: Every day | ORAL | 1 refills | Status: AC
  Filled 2024-07-26 – 2024-07-29 (×3): qty 90, 90d supply, fill #0
  Filled 2024-11-14: qty 60, 60d supply, fill #1

## 2024-07-26 MED ORDER — LISINOPRIL 10 MG PO TABS: 10.0000 mg | ORAL_TABLET | Freq: Every day | ORAL | 3 refills | Status: AC

## 2024-07-26 MED ORDER — LEVOTHYROXINE SODIUM 25 MCG PO TABS: 25.0000 ug | ORAL_TABLET | Freq: Every morning | ORAL | 2 refills | Status: AC

## 2024-07-26 MED ORDER — CYANOCOBALAMIN 2000 MCG PO TABS: 2000.0000 ug | ORAL_TABLET | Freq: Every day | ORAL | 1 refills | Status: AC

## 2024-07-26 MED ORDER — LISINOPRIL 10 MG PO TABS
10.0000 mg | ORAL_TABLET | Freq: Every day | ORAL | 1 refills | Status: AC
  Filled 2024-07-26: qty 90, 90d supply, fill #0

## 2024-07-27 ENCOUNTER — Other Ambulatory Visit: Payer: Self-pay

## 2024-07-28 ENCOUNTER — Other Ambulatory Visit: Payer: Self-pay

## 2024-07-28 ENCOUNTER — Other Ambulatory Visit (HOSPITAL_BASED_OUTPATIENT_CLINIC_OR_DEPARTMENT_OTHER): Payer: Self-pay

## 2024-07-28 ENCOUNTER — Other Ambulatory Visit (HOSPITAL_COMMUNITY): Payer: Self-pay

## 2024-07-28 MED ORDER — LEVOTHYROXINE SODIUM 25 MCG PO TABS
25.0000 ug | ORAL_TABLET | Freq: Every morning | ORAL | 1 refills | Status: AC
  Filled 2024-07-28 – 2024-08-11 (×2): qty 90, 90d supply, fill #0
  Filled 2024-11-14: qty 90, 90d supply, fill #1

## 2024-07-28 MED ORDER — JARDIANCE 10 MG PO TABS
10.0000 mg | ORAL_TABLET | Freq: Every day | ORAL | 1 refills | Status: AC
  Filled 2024-07-28: qty 90, 90d supply, fill #0

## 2024-07-29 ENCOUNTER — Other Ambulatory Visit (HOSPITAL_BASED_OUTPATIENT_CLINIC_OR_DEPARTMENT_OTHER): Payer: Self-pay

## 2024-07-29 ENCOUNTER — Other Ambulatory Visit (HOSPITAL_COMMUNITY): Payer: Self-pay

## 2024-07-30 ENCOUNTER — Other Ambulatory Visit (HOSPITAL_COMMUNITY): Payer: Self-pay

## 2024-08-01 ENCOUNTER — Other Ambulatory Visit: Payer: Self-pay

## 2024-08-02 ENCOUNTER — Other Ambulatory Visit (HOSPITAL_COMMUNITY): Payer: Self-pay

## 2024-08-09 ENCOUNTER — Other Ambulatory Visit (HOSPITAL_COMMUNITY): Payer: Self-pay

## 2024-08-11 ENCOUNTER — Other Ambulatory Visit (HOSPITAL_COMMUNITY): Payer: Self-pay

## 2024-08-11 ENCOUNTER — Other Ambulatory Visit: Payer: Self-pay

## 2024-08-12 DIAGNOSIS — R001 Bradycardia, unspecified: Secondary | ICD-10-CM

## 2024-08-24 ENCOUNTER — Ambulatory Visit (HOSPITAL_COMMUNITY)
Admission: RE | Admit: 2024-08-24 | Discharge: 2024-08-24 | Disposition: A | Discharge status: Home / Self Care | Source: Ambulatory Visit | Attending: Cardiology | Admitting: Cardiology

## 2024-08-24 DIAGNOSIS — R001 Bradycardia, unspecified: Secondary | ICD-10-CM | POA: Diagnosis present

## 2024-08-24 DIAGNOSIS — R5383 Other fatigue: Secondary | ICD-10-CM | POA: Diagnosis present

## 2024-08-24 DIAGNOSIS — R0609 Other forms of dyspnea: Secondary | ICD-10-CM | POA: Insufficient documentation

## 2024-08-24 LAB — ECHOCARDIOGRAM COMPLETE
Area-P 1/2: 2.91 cm2
P 1/2 time: 580 ms
S' Lateral: 2.7 cm

## 2024-09-19 ENCOUNTER — Ambulatory Visit (HOSPITAL_BASED_OUTPATIENT_CLINIC_OR_DEPARTMENT_OTHER): Admitting: Cardiology

## 2024-09-20 ENCOUNTER — Other Ambulatory Visit (HOSPITAL_COMMUNITY): Payer: Self-pay | Admitting: Nephrology

## 2024-09-20 DIAGNOSIS — R7989 Other specified abnormal findings of blood chemistry: Secondary | ICD-10-CM

## 2024-09-20 DIAGNOSIS — R8290 Unspecified abnormal findings in urine: Secondary | ICD-10-CM

## 2024-10-04 ENCOUNTER — Ambulatory Visit (HOSPITAL_COMMUNITY)
Admission: RE | Admit: 2024-10-04 | Discharge: 2024-10-04 | Disposition: A | Source: Ambulatory Visit | Attending: Nephrology | Admitting: Nephrology

## 2024-10-04 DIAGNOSIS — R8290 Unspecified abnormal findings in urine: Secondary | ICD-10-CM | POA: Insufficient documentation

## 2024-10-04 DIAGNOSIS — R7989 Other specified abnormal findings of blood chemistry: Secondary | ICD-10-CM | POA: Insufficient documentation

## 2024-10-19 ENCOUNTER — Other Ambulatory Visit (HOSPITAL_COMMUNITY): Payer: Self-pay

## 2024-10-20 ENCOUNTER — Other Ambulatory Visit: Payer: Self-pay

## 2024-10-20 MED FILL — Atorvastatin Calcium Tab 10 MG (Base Equivalent): 10.0000 mg | ORAL | 100 days supply | Qty: 50 | Fill #0 | Status: AC

## 2024-10-24 ENCOUNTER — Ambulatory Visit: Admitting: Urology

## 2024-10-24 ENCOUNTER — Encounter: Payer: Self-pay | Admitting: Urology

## 2024-10-24 VITALS — BP 150/69 | HR 59

## 2024-10-24 DIAGNOSIS — N1330 Unspecified hydronephrosis: Secondary | ICD-10-CM

## 2024-10-24 LAB — MICROSCOPIC EXAMINATION
Bacteria, UA: NONE SEEN
WBC, UA: NONE SEEN /HPF (ref 0–5)

## 2024-10-24 LAB — URINALYSIS, ROUTINE W REFLEX MICROSCOPIC
Bilirubin, UA: NEGATIVE
Ketones, UA: NEGATIVE
Leukocytes,UA: NEGATIVE
Nitrite, UA: NEGATIVE
Protein,UA: NEGATIVE
Specific Gravity, UA: 1.02 (ref 1.005–1.030)
Urobilinogen, Ur: 0.2 mg/dL (ref 0.2–1.0)
pH, UA: 5.5 (ref 5.0–7.5)

## 2024-10-24 NOTE — Progress Notes (Signed)
 "  10/24/2024 8:52 AM   Ashley Lloyd 1951/03/12 [id number]  Referring provider: Douglas Rule, MD [address] [place],  [place] [address]  hydronephrosis   HPI: Ashley Lloyd is a 73yo here for evaluation of hydronephrosis. She underwent renal US  10/04/24 which showed mild to moderate right hydronephrosis. Creatinine 1.00. She denies any right flank pain. No personal hx of nephrolithiasis. She denies gross hematuria but does have a history of microhematuria. She worked in scientist, physiological for 20 years. No tobacco abuse history.    PMH: Past Medical History:  Diagnosis Date   Cancer (HCC) 2000   left breast cancer, s/p lumpectomy and XRT   HLD (hyperlipidemia)    Shingles    Vitamin D  deficiency disease     Surgical History: Past Surgical History:  Procedure Laterality Date   BREAST LUMPECTOMY     BREAST SURGERY     SHOULDER SURGERY Left 2001   rotator cuff   VAGINAL DELIVERY     4x    Home Medications:  Allergies as of 10/24/2024       Reactions   Celecoxib Swelling, Rash   CELEBREX REACTION: Rash , swelling        Medication List        Accurate as of October 24, 2024  8:52 AM. If you have any questions, ask your nurse or doctor.          atorvastatin  10 MG tablet Commonly known as: LIPITOR Take 1 tablet (10 mg total) by mouth daily.   atorvastatin  10 MG tablet Commonly known as: LIPITOR Take 1 tablet (10 mg total) by mouth every other day.   cyanocobalamin  2000 MCG tablet Take 1 tablet (2,000 mcg total) by mouth daily.   Jardiance  10 MG Tabs tablet Generic drug: empagliflozin  Take 10 mg by mouth daily.   Jardiance  10 MG Tabs tablet Generic drug: empagliflozin  Take 1 tablet (10 mg total) by mouth daily.   Jardiance  10 MG Tabs tablet Generic drug: empagliflozin  Take 1 tablet (10 mg total) by mouth daily.   levothyroxine  25 MCG tablet Commonly known as: SYNTHROID  Take 1 tablet (25 mcg total) by mouth in the morning  on an empty stomach.   levothyroxine  25 MCG tablet Commonly known as: SYNTHROID  Take 1 tablet (25 mcg total) by mouth in the morning on an empty stomach.   lisinopril  10 MG tablet Commonly known as: ZESTRIL  Take 1 tablet (10 mg total) by mouth daily.   lisinopril  10 MG tablet Commonly known as: ZESTRIL  Take 1 tablet (10 mg total) by mouth daily.        Allergies: Allergies[1]  Family History: Family History  Problem Relation Age of Onset   Arthritis Mother    Hypertension Mother    Kidney disease Mother    Cancer Mother        breast cancer, kidney cancer   Arthritis Father    Heart disease Father    Fibromyalgia Sister    Cancer Brother        pancreatic cancer   Cancer Maternal Aunt        breast cancer    Social History:  reports that she has never smoked. She has never used smokeless tobacco. She reports that she does not drink alcohol and does not use drugs.  ROS: All other review of systems were reviewed and are negative except what is noted above in HPI  Physical Exam: BP (!) 150/69   Pulse (!) 59  Constitutional:  Alert and oriented, No acute distress. HEENT: Chambers AT, moist mucus membranes.  Trachea midline, no masses. Cardiovascular: No clubbing, cyanosis, or edema. Respiratory: Normal respiratory effort, no increased work of breathing. GI: Abdomen is soft, nontender, nondistended, no abdominal masses GU: No CVA tenderness.  Lymph: No cervical or inguinal lymphadenopathy. Skin: No rashes, bruises or suspicious lesions. Neurologic: Grossly intact, no focal deficits, moving all 4 extremities. Psychiatric: Normal mood and affect.  Laboratory Data: Lab Results  Component Value Date   WBC 10.6 (H) 07/11/2024   HGB 13.2 07/11/2024   HCT 39.7 07/11/2024   MCV 92.3 07/11/2024   PLT 355 07/11/2024    Lab Results  Component Value Date   CREATININE 1.04 (H) 07/11/2024    No results found for: PSA  No results found for: TESTOSTERONE  Lab  Results  Component Value Date   HGBA1C 5.8 (H) 06/26/2021    Urinalysis    Component Value Date/Time   COLORURINE COLORLESS (A) 07/11/2024 1601   APPEARANCEUR CLEAR 07/11/2024 1601   LABSPEC 1.016 07/11/2024 1601   PHURINE 5.0 07/11/2024 1601   GLUCOSEU >1,000 (A) 07/11/2024 1601   HGBUR MODERATE (A) 07/11/2024 1601   BILIRUBINUR NEGATIVE 07/11/2024 1601   KETONESUR NEGATIVE 07/11/2024 1601   PROTEINUR NEGATIVE 07/11/2024 1601   NITRITE NEGATIVE 07/11/2024 1601   LEUKOCYTESUR NEGATIVE 07/11/2024 1601    Lab Results  Component Value Date   BACTERIA NONE SEEN 07/11/2024    Pertinent Imaging: Renal US  10/04/2024: Images reviewed and discussed with the patient  No results found for this or any previous visit.  No results found for this or any previous visit.  No results found for this or any previous visit.  No results found for this or any previous visit.  Results for orders placed during the hospital encounter of 10/04/24  US  RENAL  Narrative EXAM: US  Retroperitoneum Complete, Renal. 10/04/2024 12:13:08 PM  TECHNIQUE: Real-time ultrasonography of the retroperitoneum renal was performed.  COMPARISON: None available  CLINICAL HISTORY: abnormal urine/creatinine abnormal urine/creatinine  FINDINGS:  FINDINGS: RIGHT KIDNEY/URETER: Right kidney measures 9.4 x 5.1 x 5.4 cm. Normal cortical echogenicity. Small right renal cortical thinning. Mild-to-moderate right hydronephrosis. Simple right renal cortical cyst is identified for which no follow up imaging is recommended. No intrarenal masses. No calculus.  LEFT KIDNEY/URETER: Left kidney measures 9.5 x 4.8 x 4.4 cm. Normal cortical echogenicity. Small left renal cortical thinning. No hydronephrosis on the left. Simple left parapelvic cyst is identified for which no follow up imaging is recommended. No intrarenal masses. No calculus.  BLADDER: Pre-void Bladder volume is 407 mL. Bilateral ureteral jets were  identified within the bladder. No significant postvoid residual noted. Unremarkable appearance of the bladder.  IMPRESSION: 1. Mild-to-moderate right hydronephrosis. 2. Mild bilateral renal cortical thinning.  Electronically signed by: Dorethia Molt MD 10/11/2024 02:23 AM EST RP Workstation: [id number]  No results found for this or any previous visit.  No results found for this or any previous visit.  No results found for this or any previous visit.   Assessment & Plan:    1. Hydronephrosis, unspecified hydronephrosis type (Primary) -CT hematuria Urine cytology - Urinalysis, Routine w reflex microscopic   No follow-ups on file.  Ashley Clara, MD  [facility]      [1]  Allergies Allergen Reactions   Celecoxib Swelling and Rash    CELEBREX REACTION: Rash , swelling   "

## 2024-10-24 NOTE — Patient Instructions (Signed)
 Hydronephrosis  Hydronephrosis is the swelling of one or both kidneys due to a blockage that stops urine from flowing out of the body. Kidneys filter waste from the blood and produce urine. This condition can lead to kidney failure and may become life-threatening if not treated promptly. What are the causes? In infants and children, common causes include problems that occur when a baby is developing in the womb. These can include problems in the kidneys or in the tubes that drain urine into the bladder (ureters). In adults, common causes include: Kidney stones. Pregnancy. A tumor or cyst in the abdomen or pelvis. An enlarged prostate gland. Other causes include: Bladder infection. Scar tissue from a previous surgery or injury. A blood clot. Cancer of the prostate, bladder, uterus, ovary, or colon. What are the signs or symptoms? Symptoms of this condition include: Pain or discomfort in your side (flank) or abdomen. Swelling in your abdomen. Nausea and vomiting. Fever. Pain when passing urine. Feelings of urgency when you need to urinate. Urinating more often than normal. In some cases, you may not have any symptoms. How is this diagnosed? This condition may be diagnosed based on: Your symptoms and medical history. A physical exam. Blood and urine tests. Imaging tests, such as an ultrasound, CT scan, or MRI. A procedure to look at your urinary tract and bladder by inserting a scope into the urethra (cystoscopy). How is this treated? Treatment for this condition depends on where the blockage is, how long it has been there, and what caused it. The goal of treatment is to remove the blockage. Treatment may include: Antibiotic medicines to treat or prevent infection. A procedure to place a small, thin tube (stent) into a blocked ureter. The stent will keep the ureter open so that urine can drain through it. A nonsurgical procedure that crushes kidney stones with shock waves  (extracorporeal shock wave lithotripsy). If kidney failure occurs, treatment may include dialysis or a kidney transplant. Follow these instructions at home:  Take over-the-counter and prescription medicines only as told by your health care provider. If you were prescribed an antibiotic medicine, take it exactly as told by your health care provider. Do not stop taking the antibiotic even if you start to feel better. Rest and return to your normal activities as told by your health care provider. Ask your health care provider what activities are safe for you. Drink enough fluid to keep your urine pale yellow. Keep all follow-up visits. This is important. Contact a health care provider if: You continue to have symptoms after treatment. You develop new symptoms. Your urine becomes cloudy or bloody. You have a fever. Get help right away if: You have severe flank or abdominal pain. You cannot drink fluids without vomiting. Summary Hydronephrosis is the swelling of one or both kidneys due to a blockage that stops urine from flowing out of the body. Hydronephrosis can lead to kidney failure and may become life-threatening if not treated promptly. The goal of treatment is to remove the blockage. It may include a procedure to insert a stent into a blocked ureter, a procedure to break up kidney stones, or taking antibiotic medicines. Follow your health care provider's instructions for taking care of yourself at home, including instructions about drinking fluids, taking medicines, and limiting activities. This information is not intended to replace advice given to you by your health care provider. Make sure you discuss any questions you have with your health care provider. Document Revised: 07/21/2023 Document Reviewed: 07/21/2023 [employer]  Patient Education  2024 [employer].

## 2024-10-25 LAB — CYTOLOGY, URINE

## 2024-11-08 ENCOUNTER — Ambulatory Visit: Payer: Self-pay

## 2024-11-10 ENCOUNTER — Ambulatory Visit (HOSPITAL_COMMUNITY)
Admission: RE | Admit: 2024-11-10 | Discharge: 2024-11-10 | Disposition: A | Discharge status: Home / Self Care | Payer: Self-pay | Source: Ambulatory Visit | Attending: Urology | Admitting: Urology

## 2024-11-10 DIAGNOSIS — N1330 Unspecified hydronephrosis: Secondary | ICD-10-CM | POA: Insufficient documentation

## 2024-11-10 LAB — POCT I-STAT CREATININE: Creatinine, Ser: 1.2 mg/dL — ABNORMAL HIGH (ref 0.44–1.00)

## 2024-11-10 MED ORDER — IOHEXOL 300 MG/ML  SOLN
125.0000 mL | Freq: Once | INTRAMUSCULAR | Status: AC | PRN
  Administered 2024-11-10: 125 mL via INTRAVENOUS

## 2024-11-14 ENCOUNTER — Other Ambulatory Visit: Payer: Self-pay

## 2024-11-14 ENCOUNTER — Other Ambulatory Visit (HOSPITAL_COMMUNITY): Payer: Self-pay

## 2024-11-14 ENCOUNTER — Other Ambulatory Visit (HOSPITAL_BASED_OUTPATIENT_CLINIC_OR_DEPARTMENT_OTHER): Payer: Self-pay

## 2024-11-14 MED ORDER — FUROSEMIDE 20 MG PO TABS
20.0000 mg | ORAL_TABLET | Freq: Every day | ORAL | 11 refills | Status: AC
  Filled 2024-11-14: qty 30, 30d supply, fill #0

## 2024-11-15 ENCOUNTER — Other Ambulatory Visit: Payer: Self-pay

## 2024-11-16 ENCOUNTER — Other Ambulatory Visit (HOSPITAL_COMMUNITY): Payer: Self-pay | Admitting: Nephrology

## 2024-11-16 DIAGNOSIS — R319 Hematuria, unspecified: Secondary | ICD-10-CM

## 2024-11-16 DIAGNOSIS — I10 Essential (primary) hypertension: Secondary | ICD-10-CM

## 2024-11-16 DIAGNOSIS — R7989 Other specified abnormal findings of blood chemistry: Secondary | ICD-10-CM

## 2024-11-17 ENCOUNTER — Other Ambulatory Visit: Payer: Self-pay

## 2024-11-18 ENCOUNTER — Other Ambulatory Visit: Payer: Self-pay

## 2024-11-18 ENCOUNTER — Other Ambulatory Visit (HOSPITAL_COMMUNITY): Payer: Self-pay

## 2024-12-28 ENCOUNTER — Ambulatory Visit: Admitting: Urology

## 2025-01-04 ENCOUNTER — Other Ambulatory Visit: Admitting: Urology
